# Patient Record
Sex: Female | Born: 1965 | Race: Black or African American | Hispanic: Yes | Marital: Married | Smoking: Never smoker
Health system: Southern US, Community
[De-identification: ages and names within clinical notes are randomized; demographics above are authoritative.]

## PROBLEM LIST (undated history)

## (undated) DIAGNOSIS — R102 Pelvic and perineal pain: Secondary | ICD-10-CM

## (undated) DIAGNOSIS — I10 Essential (primary) hypertension: Secondary | ICD-10-CM

## (undated) DIAGNOSIS — K5900 Constipation, unspecified: Secondary | ICD-10-CM

## (undated) DIAGNOSIS — G4730 Sleep apnea, unspecified: Secondary | ICD-10-CM

## (undated) DIAGNOSIS — N950 Postmenopausal bleeding: Secondary | ICD-10-CM

## (undated) DIAGNOSIS — Z8041 Family history of malignant neoplasm of ovary: Secondary | ICD-10-CM

## (undated) HISTORY — PX: TUBAL LIGATION: SHX77

## (undated) HISTORY — DX: Pelvic and perineal pain: R10.2

## (undated) HISTORY — DX: Constipation, unspecified: K59.00

## (undated) HISTORY — DX: Sleep apnea, unspecified: G47.30

## (undated) HISTORY — DX: Essential (primary) hypertension: I10

## (undated) HISTORY — DX: Family history of malignant neoplasm of ovary: Z80.41

## (undated) HISTORY — DX: Postmenopausal bleeding: N95.0

---

## 2009-01-01 ENCOUNTER — Emergency Department: Payer: Self-pay | Admitting: Emergency Medicine

## 2009-04-02 ENCOUNTER — Ambulatory Visit: Payer: Self-pay | Admitting: Internal Medicine

## 2010-04-22 ENCOUNTER — Ambulatory Visit: Payer: Self-pay

## 2012-01-19 ENCOUNTER — Ambulatory Visit: Payer: Self-pay

## 2013-03-15 ENCOUNTER — Ambulatory Visit: Payer: Self-pay

## 2013-08-31 ENCOUNTER — Ambulatory Visit: Payer: Self-pay | Admitting: Unknown Physician Specialty

## 2015-01-24 ENCOUNTER — Encounter: Payer: Self-pay | Admitting: Internal Medicine

## 2015-02-11 ENCOUNTER — Ambulatory Visit (INDEPENDENT_AMBULATORY_CARE_PROVIDER_SITE_OTHER): Payer: BLUE CROSS/BLUE SHIELD | Admitting: Obstetrics and Gynecology

## 2015-02-11 ENCOUNTER — Encounter: Payer: Self-pay | Admitting: Obstetrics and Gynecology

## 2015-02-11 VITALS — BP 137/84 | HR 93 | Ht 59.0 in | Wt 140.2 lb

## 2015-02-11 DIAGNOSIS — Z803 Family history of malignant neoplasm of breast: Secondary | ICD-10-CM | POA: Diagnosis not present

## 2015-02-11 DIAGNOSIS — Z8041 Family history of malignant neoplasm of ovary: Secondary | ICD-10-CM | POA: Diagnosis not present

## 2015-02-11 DIAGNOSIS — Z800 Family history of malignant neoplasm of digestive organs: Secondary | ICD-10-CM | POA: Diagnosis not present

## 2015-02-11 DIAGNOSIS — N950 Postmenopausal bleeding: Secondary | ICD-10-CM

## 2015-02-11 HISTORY — DX: Postmenopausal bleeding: N95.0

## 2015-02-11 NOTE — Patient Instructions (Signed)
1.  Endometrial biopsy is done today because of postmenopausal bleeding. 2.  There is no evidence of uterine fibroids on ultrasound or clinical exam. 3.  There is a family history of cancer risk in sister (ovarian), brother (colon),Maternal aunt ( breast cancer) 4.  Recommend genetic testing through the my risk profile.  Literature is given. 5.  Return in 2 weeks

## 2015-02-11 NOTE — Progress Notes (Signed)
GYN ENCOUNTER NOTE  Subjective:       Barbara Cantrell is a 50 y.o. [id number] female is here for gynecologic evaluation of the following issues:  1. Postmenopausal bleeding.     Gynecologic History Patient's last menstrual period was 01/09/2015 (approximate). Contraception: Menopause  Obstetric History OB History  Gravida Para Term Preterm AB SAB TAB Ectopic Multiple Living  3 3 2 1      2     # Outcome Date GA Lbr Len/2nd Weight Sex Delivery Anes PTL Lv  3 Term 1997   7 lb 2.4 oz (3.243 kg) F Vag-Spont   Y  2 Term 1995   7 lb 2.2 oz (3.239 kg) F Vag-Spont   Y  1 Preterm 1994     Vag-Spont   FD      Past Medical History  Diagnosis Date  . Hypertension   . Pelvic pain in female   . Constipation   . PMB (postmenopausal bleeding)     Past Surgical History  Procedure Laterality Date  . Tubal ligation      No current outpatient prescriptions on file prior to visit.   No current facility-administered medications on file prior to visit.    No Known Allergies  Social History   Social History  . Marital Status: Married    Spouse Name: N/A  . Number of Children: N/A  . Years of Education: N/A   Occupational History  . Not on file.   Social History Main Topics  . Smoking status: Never Smoker   . Smokeless tobacco: Not on file  . Alcohol Use: Yes     Comment: occas  . Drug Use: No  . Sexual Activity: Yes    Birth Control/ Protection: Surgical   Other Topics Concern  . Not on file   Social History Narrative  . No narrative on file    Family History  Problem Relation Age of Onset  . Ovarian cancer Sister   . Colon cancer Brother   . Asthma Maternal Aunt   . Diabetes Maternal Aunt   . Heart disease Neg Hx     The following portions of the patient's history were reviewed and updated as appropriate: allergies, current medications, past family history, past medical history, past social history, past surgical history and problem list.  Review of  Systems Review of Systems - General ROS: negative for - chills, fatigue, fever, hot flashes, malaise or night sweats Hematological and Lymphatic ROS: negative for - bleeding problems or swollen lymph nodes Gastrointestinal ROS: negative for - abdominal pain, blood in stools, change in bowel habits and nausea/vomiting Musculoskeletal ROS: negative for - joint pain, muscle pain or muscular weakness Genito-Urinary ROS: negative for - change in menstrual cycle, dysmenorrhea, dyspareunia, dysuria, genital discharge, genital ulcers, hematuria, incontinence, irregular/heavy menses, nocturia or pelvic pain  Objective:   BP 137/84 mmHg  Pulse 93  Ht 4\' 11"  (1.499 m)  Wt 140 lb 3.4 oz (63.599 kg)  BMI 28.30 kg/m2  LMP 01/09/2015 (Approximate) CONSTITUTIONAL: Well-developed, well-nourished female in no acute distress.  HENT:  Normocephalic, atraumatic.  NECK: Not examined SKIN: Skin is warm and dry. No rash noted. Not diaphoretic. No erythema. No pallor. NEUROLGIC: Alert and oriented to person, place, and time. PSYCHIATRIC: Normal mood and affect. Normal behavior. Normal judgment and thought content. CARDIOVASCULAR:Not Examined RESPIRATORY: Not Examined BREASTS: Not Examined ABDOMEN: Soft, non distended; Non tender.  No Organomegaly. PELVIC:  External Genitalia: Normal  BUS: Normal  Vagina: Normal  Cervix:  Normal  Uterus: Normal size, shape,consistency, mobile; Anteverted; nontender  Adnexa: Normal  RV: Normal External exam  Bladder: Nontender MUSCULOSKELETAL: Normal range of motion. No tenderness.  No cyanosis, clubbing, or edema.  Outside ultrasound is reviewed with no evidence of gynecologic pathology.  The endometrial stripe was not measured.  PROCEDURE: Endometrial biopsy  Endometrial Biopsy Procedure Note  Pre-operative Diagnosis: Postmenopausal bleeding  Post-operative Diagnosis: Postmenopausal bleeding  Procedure Details   Urine pregnancy test was not done.  The risks  (including infection, bleeding, pain, and uterine perforation) and benefits of the procedure were explained to the patient and Verbal informed consent was obtained.  Antibiotic prophylaxis against endocarditis was not indicated.   The patient was placed in the dorsal lithotomy position.  Bimanual exam showed the uterus to be in the anteroflexed position.  A Graves' speculum inserted in the vagina, and the cervix prepped with povidone iodine.  Endocervical curettage with a Kevorkian curette was not performed.   A sharp tenaculum was Not applied to the anterior lip of the cervix for stabilization.  A sterile uterine sound was used to sound the uterus to a depth of 7.5cm.  A Mylex 3mm curette was used to sample the endometrium.  Sample was sent for pathologic examination.  Condition: Stable  Complications: None  Plan:  The patient was advised to call for any fever or for prolonged or severe pain or bleeding. She was advised to use OTC acetaminophen and OTC ibuprofen as needed for mild to moderate pain. She was advised to avoid vaginal intercourse for 48 hours or until the bleeding has completely stopped.  Attending Physician Documentation: Herold Harms, MD     Assessment:   1. PMB (postmenopausal bleeding) - Pathology  2. Family history of breast cancer  3. Family history of colon cancer  4. Family history of ovarian cancer     Plan:   1.  Endometrial biopsy. 2.  Discussion regarding My Risk Assessment genetic testing was completed.  Literature is given. 3.  Return in 2 weeks for follow-up on endometrial biopsy results and further management planning; possible consideration of genetic testing.  Herold Harms, MD  Note: This dictation was prepared with Dragon dictation along with smaller phrase technology. Any transcriptional errors that result from this process are unintentional.

## 2015-02-13 LAB — PATHOLOGY

## 2015-02-25 ENCOUNTER — Encounter: Payer: Self-pay | Admitting: Obstetrics and Gynecology

## 2015-02-25 ENCOUNTER — Ambulatory Visit (INDEPENDENT_AMBULATORY_CARE_PROVIDER_SITE_OTHER): Payer: BLUE CROSS/BLUE SHIELD | Admitting: Obstetrics and Gynecology

## 2015-02-25 VITALS — BP 121/74 | HR 79 | Ht 59.0 in | Wt 141.0 lb

## 2015-02-25 DIAGNOSIS — Z803 Family history of malignant neoplasm of breast: Secondary | ICD-10-CM

## 2015-02-25 DIAGNOSIS — Z800 Family history of malignant neoplasm of digestive organs: Secondary | ICD-10-CM | POA: Diagnosis not present

## 2015-02-25 DIAGNOSIS — Z8041 Family history of malignant neoplasm of ovary: Secondary | ICD-10-CM | POA: Diagnosis not present

## 2015-02-25 DIAGNOSIS — N950 Postmenopausal bleeding: Secondary | ICD-10-CM | POA: Diagnosis not present

## 2015-02-25 NOTE — Progress Notes (Signed)
Chief complaint: 1.  Postmenopausal bleeding. 2.  Family history of breast cancer, colon cancer and ovarian cancer  Patient presents for conference to follow-up on endometrial biopsy.  4 done for postmenopausal bleeding.  Pathology demonstrated no hyperplasia or carcinoma; atrophic appearing endometrium was present.  Patient had been given My Risk Assessment.  Information regarding genetic testing because of family history of colon cancer, ovarian cancer and breast cancer.  She will contact the company and check on cost and will likely get the blood work done.  When results are available, she will return for follow-up conference.  ASSESSMENT: 1.  Postmenopausal bleeding with benign endometrial biopsy. 2.  Family history of breast, colon, and ovarian cancers.  PLAN: 1.  Monitor for any further abnormal uterine bleeding.  Reassessment will be needed if bleeding occurs after 08/09/2015. 2.  Patient is to contact insurance company to see what cost.  There is too genetic testing.  Once testing is completed and results are obtained, we will contact patient review of findings.  A total of 15 minutes were spent face-to-face with the patient during this encounter and over half of that time dealt with counseling and coordination of care.  Herold Harms, MD  Note: This dictation was prepared with Dragon dictation along with smaller phrase technology. Any transcriptional errors that result from this process are unintentional.

## 2015-02-25 NOTE — Patient Instructions (Signed)
1.  Patient is to have my risk genetic testing done. 2.  We will contact patient with results come in, and then call her in for conference. 3.  Endometrial biopsy was benign.  No further workup is needed of this condition.  Unless abnormal bleeding develops after August 09, 2015.  If that happens, reassessment will be needed. 4.  Continue follow-up with Dr. Tora Kindred, [facility] NP

## 2015-02-28 ENCOUNTER — Other Ambulatory Visit
Admission: RE | Admit: 2015-02-28 | Discharge: 2015-02-28 | Disposition: A | Discharge status: Home / Self Care | Payer: BLUE CROSS/BLUE SHIELD | Source: Ambulatory Visit | Attending: Internal Medicine | Admitting: Internal Medicine

## 2015-02-28 DIAGNOSIS — N83201 Unspecified ovarian cyst, right side: Secondary | ICD-10-CM | POA: Diagnosis not present

## 2015-02-28 DIAGNOSIS — N83202 Unspecified ovarian cyst, left side: Secondary | ICD-10-CM | POA: Insufficient documentation

## 2015-02-28 DIAGNOSIS — N950 Postmenopausal bleeding: Secondary | ICD-10-CM | POA: Insufficient documentation

## 2015-02-28 LAB — TSH: TSH: 0.777 u[IU]/mL (ref 0.350–4.500)

## 2015-02-28 LAB — T4, FREE: FREE T4: 0.71 ng/dL (ref 0.61–1.12)

## 2015-03-01 LAB — FSH/LH
FSH: 70.6 m[IU]/mL
LH: 32 m[IU]/mL

## 2015-03-01 LAB — T3: T3 TOTAL: 149 ng/dL (ref 71–180)

## 2015-03-01 LAB — ESTRADIOL: Estradiol: 6.9 pg/mL

## 2015-09-02 DIAGNOSIS — M545 Low back pain: Secondary | ICD-10-CM | POA: Diagnosis not present

## 2015-09-02 DIAGNOSIS — I10 Essential (primary) hypertension: Secondary | ICD-10-CM | POA: Diagnosis not present

## 2015-09-02 DIAGNOSIS — M542 Cervicalgia: Secondary | ICD-10-CM | POA: Diagnosis not present

## 2015-09-02 DIAGNOSIS — M791 Myalgia: Secondary | ICD-10-CM | POA: Diagnosis not present

## 2015-09-04 ENCOUNTER — Ambulatory Visit
Admission: RE | Admit: 2015-09-04 | Discharge: 2015-09-04 | Disposition: A | Discharge status: Home / Self Care | Payer: BLUE CROSS/BLUE SHIELD | Source: Ambulatory Visit | Attending: Nurse Practitioner | Admitting: Nurse Practitioner

## 2015-09-04 ENCOUNTER — Other Ambulatory Visit: Payer: Self-pay | Admitting: Nurse Practitioner

## 2015-09-04 DIAGNOSIS — R52 Pain, unspecified: Secondary | ICD-10-CM

## 2015-09-04 DIAGNOSIS — M545 Low back pain: Secondary | ICD-10-CM | POA: Insufficient documentation

## 2015-09-04 DIAGNOSIS — M542 Cervicalgia: Secondary | ICD-10-CM | POA: Insufficient documentation

## 2015-09-04 DIAGNOSIS — S199XXA Unspecified injury of neck, initial encounter: Secondary | ICD-10-CM | POA: Diagnosis not present

## 2016-03-23 DIAGNOSIS — I10 Essential (primary) hypertension: Secondary | ICD-10-CM | POA: Diagnosis not present

## 2016-03-23 DIAGNOSIS — J309 Allergic rhinitis, unspecified: Secondary | ICD-10-CM | POA: Diagnosis not present

## 2016-03-23 DIAGNOSIS — M542 Cervicalgia: Secondary | ICD-10-CM | POA: Diagnosis not present

## 2016-03-23 DIAGNOSIS — Z0001 Encounter for general adult medical examination with abnormal findings: Secondary | ICD-10-CM | POA: Diagnosis not present

## 2016-04-13 DIAGNOSIS — Z1231 Encounter for screening mammogram for malignant neoplasm of breast: Secondary | ICD-10-CM | POA: Diagnosis not present

## 2016-11-08 DIAGNOSIS — D23121 Other benign neoplasm of skin of left upper eyelid, including canthus: Secondary | ICD-10-CM | POA: Diagnosis not present

## 2016-11-08 DIAGNOSIS — L739 Follicular disorder, unspecified: Secondary | ICD-10-CM | POA: Diagnosis not present

## 2016-11-08 DIAGNOSIS — L918 Other hypertrophic disorders of the skin: Secondary | ICD-10-CM | POA: Diagnosis not present

## 2016-11-08 DIAGNOSIS — D485 Neoplasm of uncertain behavior of skin: Secondary | ICD-10-CM | POA: Diagnosis not present

## 2016-12-06 DIAGNOSIS — D23111 Other benign neoplasm of skin of right upper eyelid, including canthus: Secondary | ICD-10-CM | POA: Diagnosis not present

## 2016-12-06 DIAGNOSIS — D23121 Other benign neoplasm of skin of left upper eyelid, including canthus: Secondary | ICD-10-CM | POA: Diagnosis not present

## 2016-12-06 DIAGNOSIS — D485 Neoplasm of uncertain behavior of skin: Secondary | ICD-10-CM | POA: Diagnosis not present

## 2017-03-28 ENCOUNTER — Encounter: Payer: Self-pay | Admitting: Nurse Practitioner

## 2017-03-28 ENCOUNTER — Ambulatory Visit: Payer: BLUE CROSS/BLUE SHIELD | Admitting: Nurse Practitioner

## 2017-03-28 VITALS — BP 110/70 | HR 79 | Resp 16 | Ht 59.0 in | Wt 122.0 lb

## 2017-03-28 DIAGNOSIS — Z1239 Encounter for other screening for malignant neoplasm of breast: Secondary | ICD-10-CM

## 2017-03-28 DIAGNOSIS — R300 Dysuria: Secondary | ICD-10-CM | POA: Diagnosis not present

## 2017-03-28 DIAGNOSIS — Z1231 Encounter for screening mammogram for malignant neoplasm of breast: Secondary | ICD-10-CM

## 2017-03-28 DIAGNOSIS — E782 Mixed hyperlipidemia: Secondary | ICD-10-CM

## 2017-03-28 DIAGNOSIS — Z0001 Encounter for general adult medical examination with abnormal findings: Secondary | ICD-10-CM | POA: Diagnosis not present

## 2017-03-28 DIAGNOSIS — I10 Essential (primary) hypertension: Secondary | ICD-10-CM | POA: Insufficient documentation

## 2017-03-28 DIAGNOSIS — Z124 Encounter for screening for malignant neoplasm of cervix: Secondary | ICD-10-CM | POA: Diagnosis not present

## 2017-03-28 DIAGNOSIS — E559 Vitamin D deficiency, unspecified: Secondary | ICD-10-CM | POA: Diagnosis not present

## 2017-03-28 NOTE — Progress Notes (Signed)
[facility] [address] [place], [place] [id number]  Internal MEDICINE  Office Visit Note  Patient Name: Barbara Cantrell  [id number]  [id number]  Date of Service: 03/28/2017  Chief Complaint  Patient presents with  . Hypertension     Hypertension  This is a chronic problem. The current episode started more than 1 year ago. The problem has been gradually improving since onset. The problem is controlled. Associated symptoms include malaise/fatigue. Pertinent negatives include no chest pain, headaches, neck pain, palpitations or shortness of breath. Risk factors for coronary artery disease include post-menopausal state. Past treatments include calcium channel blockers. The current treatment provides moderate improvement. There are no compliance problems.    Pt is here for routine health maintenance examination  Current Medication: Outpatient Encounter Medications as of 03/28/2017  Medication Sig Note  . amLODipine (NORVASC) 5 MG tablet Take by mouth. 02/11/2015: [facility]  . fluticasone (FLONASE) 50 MCG/ACT nasal spray Place 1 spray into both nostrils.   Marland Kitchen. ibuprofen (ADVIL,MOTRIN) 600 MG tablet TAKE 1 TABLET(S) BY MOUTH THREE TIMES A DAY AS NEEDED FOR PAIN 02/11/2015: Received from: External Pharmacy  . montelukast (SINGULAIR) 10 MG tablet TAKE 1 TABLET BY MOUTH EVERY DAY FOR SINUSES    No facility-administered encounter medications on file as of 03/28/2017.     Surgical History: Past Surgical History:  Procedure Laterality Date  . TUBAL LIGATION      Medical History: Past Medical History:  Diagnosis Date  . Constipation   . Hypertension   . Pelvic pain in female   . PMB (postmenopausal bleeding)   . Postmenopausal bleeding 02/11/2015    Family History: Family History  Problem Relation Age of Onset  . Ovarian cancer Sister   . Colon cancer Brother   . Asthma Maternal Aunt   . Diabetes Maternal Aunt   . Kidney disease Maternal  Aunt   . Heart disease Neg Hx       Review of Systems  Constitutional: Positive for malaise/fatigue. Negative for activity change, chills, fatigue and unexpected weight change.  HENT: Negative for congestion, postnasal drip, rhinorrhea, sneezing and sore throat.   Eyes: Negative.  Negative for redness.  Respiratory: Negative for cough, chest tightness and shortness of breath.   Cardiovascular: Negative for chest pain and palpitations.  Gastrointestinal: Negative for abdominal pain, constipation, diarrhea, nausea and vomiting.  Genitourinary: Negative.  Negative for dysuria and frequency.  Musculoskeletal: Positive for arthralgias. Negative for back pain, joint swelling and neck pain.       Bilateral wrist and hand pin, generally during the night.   Skin: Negative for rash.  Neurological: Negative for tremors, weakness, numbness and headaches.  Hematological: Negative for adenopathy. Does not bruise/bleed easily.  Psychiatric/Behavioral: Negative for behavioral problems (Depression), sleep disturbance and suicidal ideas. The patient is not nervous/anxious.     Today's Vitals   03/28/17 0955  BP: 110/70  Pulse: 79  Resp: 16  SpO2: 99%  Weight: 122 lb (55.3 kg)  Height: 4\' 11"  (1.499 m)    Physical Exam  Constitutional: She is oriented to person, place, and time. She appears well-developed and well-nourished. No distress.  HENT:  Head: Normocephalic and atraumatic.  Mouth/Throat: Oropharynx is clear and moist. No oropharyngeal exudate.  Eyes: EOM are normal. Pupils are equal, round, and reactive to light.  Neck: Normal range of motion. Neck supple. No JVD present. No tracheal deviation present. No thyromegaly present.  Cardiovascular: Normal rate, regular rhythm and normal  heart sounds. Exam reveals no gallop and no friction rub.  No murmur heard. Pulmonary/Chest: Effort normal. No respiratory distress. She has no wheezes. She has no rales. She exhibits no tenderness. Right  breast exhibits no inverted nipple, no mass, no nipple discharge, no skin change and no tenderness. Left breast exhibits no inverted nipple, no mass, no nipple discharge, no skin change and no tenderness. Breasts are symmetrical.  Abdominal: Soft. Bowel sounds are normal.  Genitourinary: Rectum normal. No breast swelling, tenderness, discharge or bleeding. Pelvic exam was performed with patient supine. There is no rash, tenderness, lesion or injury on the right labia. There is no rash, tenderness, lesion or injury on the left labia. Uterus is not deviated, not enlarged, not fixed and not tender. Cervix exhibits no motion tenderness, no discharge and no friability. Right adnexum displays no mass, no tenderness and no fullness. Left adnexum displays no mass and no fullness. No erythema, tenderness or bleeding in the vagina. No foreign body in the vagina. No signs of injury around the vagina. No vaginal discharge found.  Genitourinary Comments: There is no tenderness, mass, or organomegaly present during bimanual exam.   Musculoskeletal: Normal range of motion.  Lymphadenopathy:    She has no cervical adenopathy.  Neurological: She is alert and oriented to person, place, and time. No cranial nerve deficit.  Skin: Skin is warm and dry. She is not diaphoretic.  Psychiatric: She has a normal mood and affect. Her behavior is normal. Judgment and thought content normal.  Nursing note and vitals reviewed.  Assessment/Plan: 1. Encounter for general adult medical examination with abnormal findings Annual wellness visit today with pap smear.  - CBC with Differential/Platelet - Comprehensive metabolic panel - T4, free - TSH  2. Essential hypertension Dong very well. Continue bp medication as prescribed  - Comprehensive metabolic panel - T4, free - TSH - Lipid panel  3. Mixed hyperlipidemia - Lipid panel  4. Vitamin D deficiency - Vitamin D 1,25 dihydroxy  5. Dysuria - Urinalysis, Routine w  reflex microscopic  6. Screening for breast cancer - MM DIGITAL SCREENING BILATERAL; Future  7. Routine cervical smear - Pap IG and HPV (high risk) DNA detection  General Counseling: Harriett Sine verbalizes understanding of the findings of todays visit and agrees with plan of treatment. I have discussed any further diagnostic evaluation that may be needed or ordered today. We also reviewed her medications today. she has been encouraged to call the office with any questions or concerns that should arise related to todays visit.   This patient was seen by Vincent Gros, FNP- C in Collaboration with Dr Lyndon Code as a part of collaborative care agreement    Orders Placed This Encounter  Procedures  . MM DIGITAL SCREENING BILATERAL  . Urinalysis, Routine w reflex microscopic  . CBC with Differential/Platelet  . Comprehensive metabolic panel  . T4, free  . TSH  . Lipid panel  . Vitamin D 1,25 dihydroxy      Time spent: 31 Minutes      Lyndon Code, MD  Internal Medicine

## 2017-03-29 LAB — URINALYSIS, ROUTINE W REFLEX MICROSCOPIC
Bilirubin, UA: NEGATIVE
Glucose, UA: NEGATIVE
KETONES UA: NEGATIVE
Leukocytes, UA: NEGATIVE
NITRITE UA: NEGATIVE
Protein, UA: NEGATIVE
RBC, UA: NEGATIVE
SPEC GRAV UA: 1.017 (ref 1.005–1.030)
Urobilinogen, Ur: 0.2 mg/dL (ref 0.2–1.0)
pH, UA: 7 (ref 5.0–7.5)

## 2017-03-31 LAB — PAP IG AND HPV HIGH-RISK
HPV, HIGH-RISK: NEGATIVE
PAP SMEAR COMMENT: 0

## 2017-04-06 NOTE — Progress Notes (Signed)
Let pt know about results.

## 2017-04-15 DIAGNOSIS — Z1231 Encounter for screening mammogram for malignant neoplasm of breast: Secondary | ICD-10-CM | POA: Diagnosis not present

## 2017-04-22 ENCOUNTER — Other Ambulatory Visit: Payer: Self-pay | Admitting: Internal Medicine

## 2017-09-27 ENCOUNTER — Ambulatory Visit: Payer: Self-pay | Admitting: Nurse Practitioner

## 2017-09-28 ENCOUNTER — Ambulatory Visit: Payer: BLUE CROSS/BLUE SHIELD | Admitting: Nurse Practitioner

## 2017-09-28 ENCOUNTER — Encounter: Payer: Self-pay | Admitting: Nurse Practitioner

## 2017-09-28 VITALS — BP 128/83 | HR 85 | Resp 16 | Ht 59.0 in | Wt 128.6 lb

## 2017-09-28 DIAGNOSIS — E782 Mixed hyperlipidemia: Secondary | ICD-10-CM

## 2017-09-28 DIAGNOSIS — I10 Essential (primary) hypertension: Secondary | ICD-10-CM

## 2017-09-28 NOTE — Progress Notes (Signed)
[facility] [address] [place], [place] [address]  Internal MEDICINE  Office Visit Note  Patient Name: Barbara Cantrell  [id number]  [id number]  Date of Service: 09/28/2017  Chief Complaint  Patient presents with  . Hypertension    6 month follow up    Hypertension  This is a chronic problem. The current episode started in the past 7 days. The problem has been gradually improving since onset. The problem is controlled. Pertinent negatives include no chest pain, headaches, neck pain, palpitations or shortness of breath. There are no associated agents to hypertension. Risk factors for coronary artery disease include post-menopausal state. Past treatments include calcium channel blockers. The current treatment provides moderate improvement. There are no compliance problems.        Current Medication: Outpatient Encounter Medications as of 09/28/2017  Medication Sig Note  . amLODipine (NORVASC) 5 MG tablet TAKE 1 AND 1/2 TABLET BY MOUTH EVERY DAY FOR BLOOD PRESSURE   . fluticasone (FLONASE) 50 MCG/ACT nasal spray Place 1 spray into both nostrils.   Marland Kitchen ibuprofen (ADVIL,MOTRIN) 600 MG tablet TAKE 1 TABLET(S) BY MOUTH THREE TIMES A DAY AS NEEDED FOR PAIN 02/11/2015: Received from: External Pharmacy  . montelukast (SINGULAIR) 10 MG tablet TAKE 1 TABLET BY MOUTH EVERY DAY FOR SINUSES    No facility-administered encounter medications on file as of 09/28/2017.     Surgical History: Past Surgical History:  Procedure Laterality Date  . TUBAL LIGATION      Medical History: Past Medical History:  Diagnosis Date  . Constipation   . Hypertension   . Pelvic pain in female   . PMB (postmenopausal bleeding)   . Postmenopausal bleeding 02/11/2015    Family History: Family History  Problem Relation Age of Onset  . Ovarian cancer Sister   . Colon cancer Brother   . Asthma Maternal Aunt   . Diabetes Maternal Aunt   . Kidney disease Maternal Aunt   . Heart disease Neg Hx      Social History   Socioeconomic History  . Marital status: Married    Spouse name: Not on file  . Number of children: Not on file  . Years of education: Not on file  . Highest education level: Not on file  Occupational History  . Not on file  Social Needs  . Financial resource strain: Not on file  . Food insecurity:    Worry: Not on file    Inability: Not on file  . Transportation needs:    Medical: Not on file    Non-medical: Not on file  Tobacco Use  . Smoking status: Never Smoker  . Smokeless tobacco: Never Used  Substance and Sexual Activity  . Alcohol use: Yes    Comment: occas  . Drug use: No  . Sexual activity: Yes    Birth control/protection: Surgical  Lifestyle  . Physical activity:    Days per week: Not on file    Minutes per session: Not on file  . Stress: Not on file  Relationships  . Social connections:    Talks on phone: Not on file    Gets together: Not on file    Attends religious service: Not on file    Active member of club or organization: Not on file    Attends meetings of clubs or organizations: Not on file    Relationship status: Not on file  . Intimate partner violence:    Fear of current or ex partner: Not on file  Emotionally abused: Not on file    Physically abused: Not on file    Forced sexual activity: Not on file  Other Topics Concern  . Not on file  Social History Narrative  . Not on file      Review of Systems  Constitutional: Negative for activity change, chills, fatigue and unexpected weight change.  HENT: Negative for congestion, postnasal drip, rhinorrhea, sneezing and sore throat.   Eyes: Negative.  Negative for redness.  Respiratory: Negative for cough, chest tightness and shortness of breath.   Cardiovascular: Negative for chest pain and palpitations.  Gastrointestinal: Negative for abdominal pain, constipation, diarrhea, nausea and vomiting.  Endocrine: Negative for cold intolerance, heat intolerance,  polydipsia, polyphagia and polyuria.  Genitourinary: Negative.  Negative for dysuria and frequency.  Musculoskeletal: Negative for arthralgias, back pain, joint swelling and neck pain.       .   Skin: Negative for rash.  Allergic/Immunologic: Negative for environmental allergies.  Neurological: Negative for tremors, weakness, numbness and headaches.  Hematological: Negative for adenopathy. Does not bruise/bleed easily.  Psychiatric/Behavioral: Negative for behavioral problems (Depression), sleep disturbance and suicidal ideas. The patient is not nervous/anxious.     Today's Vitals   09/28/17 0907  BP: 128/83  Pulse: 85  Resp: 16  SpO2: 98%  Weight: 128 lb 9.6 oz (58.3 kg)  Height: 4\' 11"  (1.499 m)    Physical Exam  Constitutional: She is oriented to person, place, and time. She appears well-developed and well-nourished. No distress.  HENT:  Head: Normocephalic and atraumatic.  Nose: Nose normal.  Mouth/Throat: Oropharynx is clear and moist. No oropharyngeal exudate.  Eyes: Pupils are equal, round, and reactive to light. EOM are normal.  Neck: Normal range of motion. Neck supple. No JVD present. Carotid bruit is not present. No tracheal deviation present. No thyromegaly present.  Cardiovascular: Normal rate, regular rhythm and normal heart sounds. Exam reveals no gallop and no friction rub.  No murmur heard. Pulmonary/Chest: Effort normal and breath sounds normal. No respiratory distress. She has no wheezes. She has no rales. She exhibits no tenderness.  Abdominal: Soft. Bowel sounds are normal. There is no tenderness.  Musculoskeletal: Normal range of motion.  Lymphadenopathy:    She has no cervical adenopathy.  Neurological: She is alert and oriented to person, place, and time. No cranial nerve deficit.  Skin: Skin is warm and dry. Capillary refill takes less than 2 seconds. She is not diaphoretic.  Psychiatric: She has a normal mood and affect. Her behavior is normal. Judgment  and thought content normal.  Nursing note and vitals reviewed.  Assessment/Plan:  1. Essential hypertension Well controlled. Continue bp medication as prescribed   2. Mixed hyperlipidemia Reordered fasting lipid panel today. Will treat as indicated.   General Counseling: Lily PeerNancy verbalizes understanding of the findings of todays visit and agrees with plan of treatment. I have discussed any further diagnostic evaluation that may be needed or ordered today. We also reviewed her medications today. she has been encouraged to call the office with any questions or concerns that should arise related to todays visit.   Hypertension Counseling:   The following hypertensive lifestyle modification were recommended and discussed:  1. Limiting alcohol intake to less than 1 oz/day of ethanol:(24 oz of beer or 8 oz of wine or 2 oz of 100-proof whiskey). 2. Take baby ASA 81 mg daily. 3. Importance of regular aerobic exercise and losing weight. 4. Reduce dietary saturated fat and cholesterol intake for overall cardiovascular health. 5.  Maintaining adequate dietary potassium, calcium, and magnesium intake. 6. Regular monitoring of the blood pressure. 7. Reduce sodium intake to less than 100 mmol/day (less than 2.3 gm of sodium or less than 6 gm of sodium choride)   This patient was seen by Vincent GrosHeather Genova Kiner FNP Collaboration with Dr Lyndon CodeFozia M Khan as a part of collaborative care agreement    Time spent: 15 Minutes    Dr Lyndon CodeFozia M Khan Internal medicine

## 2017-12-26 ENCOUNTER — Other Ambulatory Visit: Payer: Self-pay | Admitting: Nurse Practitioner

## 2017-12-26 DIAGNOSIS — E782 Mixed hyperlipidemia: Secondary | ICD-10-CM | POA: Diagnosis not present

## 2017-12-26 DIAGNOSIS — Z0001 Encounter for general adult medical examination with abnormal findings: Secondary | ICD-10-CM | POA: Diagnosis not present

## 2017-12-26 DIAGNOSIS — I10 Essential (primary) hypertension: Secondary | ICD-10-CM | POA: Diagnosis not present

## 2017-12-26 DIAGNOSIS — E559 Vitamin D deficiency, unspecified: Secondary | ICD-10-CM | POA: Diagnosis not present

## 2017-12-27 LAB — CBC
HEMOGLOBIN: 13.3 g/dL (ref 11.1–15.9)
Hematocrit: 39.7 % (ref 34.0–46.6)
MCH: 27.9 pg (ref 26.6–33.0)
MCHC: 33.5 g/dL (ref 31.5–35.7)
MCV: 83 fL (ref 79–97)
Platelets: 407 10*3/uL (ref 150–450)
RBC: 4.77 x10E6/uL (ref 3.77–5.28)
RDW: 13.1 % (ref 12.3–15.4)
WBC: 7.4 10*3/uL (ref 3.4–10.8)

## 2017-12-27 LAB — COMPREHENSIVE METABOLIC PANEL
ALT: 15 IU/L (ref 0–32)
AST: 12 IU/L (ref 0–40)
Albumin/Globulin Ratio: 1.7 (ref 1.2–2.2)
Albumin: 4.8 g/dL (ref 3.5–5.5)
Alkaline Phosphatase: 65 IU/L (ref 39–117)
BUN/Creatinine Ratio: 17 (ref 9–23)
BUN: 12 mg/dL (ref 6–24)
Bilirubin Total: 0.3 mg/dL (ref 0.0–1.2)
CO2: 21 mmol/L (ref 20–29)
CREATININE: 0.7 mg/dL (ref 0.57–1.00)
Calcium: 9.7 mg/dL (ref 8.7–10.2)
Chloride: 102 mmol/L (ref 96–106)
GFR calc Af Amer: 114 mL/min/{1.73_m2} (ref >59)
GFR, EST NON AFRICAN AMERICAN: 99 mL/min/{1.73_m2} (ref >59)
GLUCOSE: 97 mg/dL (ref 65–99)
Globulin, Total: 2.8 g/dL (ref 1.5–4.5)
Potassium: 4.1 mmol/L (ref 3.5–5.2)
Sodium: 139 mmol/L (ref 134–144)
Total Protein: 7.6 g/dL (ref 6.0–8.5)

## 2017-12-27 LAB — TSH: TSH: 1.07 u[IU]/mL (ref 0.450–4.500)

## 2017-12-27 LAB — LIPID PANEL W/O CHOL/HDL RATIO
Cholesterol, Total: 272 mg/dL — ABNORMAL HIGH (ref 100–199)
HDL: 50 mg/dL (ref >39)
LDL Calculated: 178 mg/dL — ABNORMAL HIGH (ref 0–99)
Triglycerides: 220 mg/dL — ABNORMAL HIGH (ref 0–149)
VLDL CHOLESTEROL CAL: 44 mg/dL — AB (ref 5–40)

## 2017-12-27 LAB — T4, FREE: FREE T4: 0.95 ng/dL (ref 0.82–1.77)

## 2017-12-27 LAB — VITAMIN D 25 HYDROXY (VIT D DEFICIENCY, FRACTURES): Vit D, 25-Hydroxy: 18.4 ng/mL — ABNORMAL LOW (ref 30.0–100.0)

## 2018-01-12 ENCOUNTER — Other Ambulatory Visit: Payer: Self-pay | Admitting: Nurse Practitioner

## 2018-01-12 ENCOUNTER — Telehealth: Payer: Self-pay

## 2018-01-12 DIAGNOSIS — E782 Mixed hyperlipidemia: Secondary | ICD-10-CM

## 2018-01-12 DIAGNOSIS — E559 Vitamin D deficiency, unspecified: Secondary | ICD-10-CM

## 2018-01-12 MED ORDER — ERGOCALCIFEROL 1.25 MG (50000 UT) PO CAPS: 50000.0000 [IU] | ORAL_CAPSULE | ORAL | 5 refills | Status: DC

## 2018-01-12 MED ORDER — ROSUVASTATIN CALCIUM 5 MG PO TABS: 5.0000 mg | ORAL_TABLET | Freq: Every day | ORAL | 3 refills | Status: DC

## 2018-01-12 NOTE — Progress Notes (Signed)
Add drisdol 50000iu weekly for vitamin d deficiency.  

## 2018-01-12 NOTE — Progress Notes (Signed)
Moderate elevation of cholesterol panel. Added generic crestor at 5mg  which she should take every evening. Her other blood work results were good.

## 2018-01-12 NOTE — Telephone Encounter (Signed)
Informed pt of results and that her new [facility]

## 2018-01-12 NOTE — Telephone Encounter (Signed)
-----   Message from Carlean JewsHeather E Boscia, NP sent at 01/12/2018  8:26 AM EST ----- Please let the patient know Moderate elevation of cholesterol panel. Added generic crestor at 5mg  which she should take every evening. Vitamin d was also low. I have started her on drisdol 50000ui weekly for the next several months. Both prescription sent to her pharmacy. Her other blood work results were good.

## 2018-01-26 ENCOUNTER — Telehealth: Payer: Self-pay | Admitting: Nurse Practitioner

## 2018-01-26 NOTE — Telephone Encounter (Signed)
LEFT MESSAGE TO RESCHEDULE 04/04/18 [email or web address].JW °

## 2018-04-04 ENCOUNTER — Ambulatory Visit: Payer: Self-pay | Admitting: Nurse Practitioner

## 2018-04-07 ENCOUNTER — Encounter: Payer: Self-pay | Admitting: Nurse Practitioner

## 2018-04-07 ENCOUNTER — Ambulatory Visit: Payer: BC Managed Care – PPO | Admitting: Nurse Practitioner

## 2018-04-07 VITALS — BP 140/82 | HR 84 | Resp 16 | Ht 59.0 in | Wt 132.0 lb

## 2018-04-07 DIAGNOSIS — E782 Mixed hyperlipidemia: Secondary | ICD-10-CM

## 2018-04-07 DIAGNOSIS — I10 Essential (primary) hypertension: Secondary | ICD-10-CM | POA: Diagnosis not present

## 2018-04-07 NOTE — Progress Notes (Signed)
[facility] [address] [place], [place] [address]  Internal MEDICINE  Office Visit Note  Patient Name: Barbara Cantrell  [id number]  [id number]  Date of Service: 04/07/2018  Chief Complaint  Patient presents with  . Medical Management of Chronic Issues    6 month follow up  . Hypertension  . Foot Pain    right    The patient is c/o right foot pain along the bottom of the foot. Hurts more after she has been sitting for a while and stands up. Will usually have to limp for a while and will get more tolerable after she has been up and around.  She also has tingling and numbness, intermittently, in both wrists and hands. Will bother her at night. Has to shake the hands to "wake them up." makes them feel swollen. No injuries. Has full ROM and strength in both hands at this time.  Started on crestor 5mg  every evening since her last visit. Labs did indicate moderate, generalized elevation of her lipid panel. Is tolerating this well. Needs to have levels and CMP rechecked. Blood pressure is well controlled.       Current Medication: Outpatient Encounter Medications as of 04/07/2018  Medication Sig Note  . amLODipine (NORVASC) 5 MG tablet TAKE 1 AND 1/2 TABLET BY MOUTH EVERY DAY FOR BLOOD PRESSURE   . ergocalciferol (DRISDOL) 1.25 MG (50000 UT) capsule Take 1 capsule (50,000 Units total) by mouth once a week.   . fluticasone (FLONASE) 50 MCG/ACT nasal spray Place 1 spray into both nostrils.   Marland Kitchen ibuprofen (ADVIL,MOTRIN) 600 MG tablet TAKE 1 TABLET(S) BY MOUTH THREE TIMES A DAY AS NEEDED FOR PAIN 02/11/2015: Received from: External Pharmacy  . montelukast (SINGULAIR) 10 MG tablet TAKE 1 TABLET BY MOUTH EVERY DAY FOR SINUSES   . rosuvastatin (CRESTOR) 5 MG tablet Take 1 tablet (5 mg total) by mouth daily.    No facility-administered encounter medications on file as of 04/07/2018.     Surgical History: Past Surgical History:  Procedure Laterality Date  . TUBAL LIGATION       Medical History: Past Medical History:  Diagnosis Date  . Constipation   . Hypertension   . Pelvic pain in female   . PMB (postmenopausal bleeding)   . Postmenopausal bleeding 02/11/2015    Family History: Family History  Problem Relation Age of Onset  . Ovarian cancer Sister   . Colon cancer Brother   . Asthma Maternal Aunt   . Diabetes Maternal Aunt   . Kidney disease Maternal Aunt   . Heart disease Neg Hx     Social History   Socioeconomic History  . Marital status: Married    Spouse name: Not on file  . Number of children: Not on file  . Years of education: Not on file  . Highest education level: Not on file  Occupational History  . Not on file  Social Needs  . Financial resource strain: Not on file  . Food insecurity:    Worry: Not on file    Inability: Not on file  . Transportation needs:    Medical: Not on file    Non-medical: Not on file  Tobacco Use  . Smoking status: Never Smoker  . Smokeless tobacco: Never Used  Substance and Sexual Activity  . Alcohol use: Yes    Comment: occas  . Drug use: No  . Sexual activity: Yes    Birth control/protection: Surgical  Lifestyle  . Physical activity:  Days per week: Not on file    Minutes per session: Not on file  . Stress: Not on file  Relationships  . Social connections:    Talks on phone: Not on file    Gets together: Not on file    Attends religious service: Not on file    Active member of club or organization: Not on file    Attends meetings of clubs or organizations: Not on file    Relationship status: Not on file  . Intimate partner violence:    Fear of current or ex partner: Not on file    Emotionally abused: Not on file    Physically abused: Not on file    Forced sexual activity: Not on file  Other Topics Concern  . Not on file  Social History Narrative  . Not on file      Review of Systems  Constitutional: Negative for activity change, chills, fatigue and unexpected weight  change.  HENT: Negative for congestion, postnasal drip, rhinorrhea, sneezing and sore throat.   Respiratory: Negative for cough, chest tightness and shortness of breath.   Cardiovascular: Negative for chest pain and palpitations.  Gastrointestinal: Negative for abdominal pain, constipation, diarrhea, nausea and vomiting.  Endocrine: Negative for cold intolerance, heat intolerance, polydipsia and polyuria.  Musculoskeletal: Negative for arthralgias, back pain, joint swelling and neck pain.  Skin: Negative for rash.  Allergic/Immunologic: Negative for environmental allergies.  Neurological: Negative for tremors, weakness, numbness and headaches.  Hematological: Negative for adenopathy. Does not bruise/bleed easily.  Psychiatric/Behavioral: Negative for behavioral problems (Depression), sleep disturbance and suicidal ideas. The patient is not nervous/anxious.    Today's Vitals   04/07/18 0853  BP: 140/82  Pulse: 84  Resp: 16  SpO2: 99%  Weight: 132 lb (59.9 kg)  Height: 4\' 11"  (1.499 m)   Body mass index is 26.66 kg/m.  Physical Exam Vitals signs and nursing note reviewed.  Constitutional:      General: She is not in acute distress.    Appearance: Normal appearance. She is well-developed. She is not diaphoretic.  HENT:     Head: Normocephalic and atraumatic.     Nose: Nose normal.     Mouth/Throat:     Pharynx: No oropharyngeal exudate.  Eyes:     Pupils: Pupils are equal, round, and reactive to light.  Neck:     Musculoskeletal: Normal range of motion and neck supple.     Thyroid: No thyromegaly.     Vascular: No carotid bruit or JVD.     Trachea: No tracheal deviation.  Cardiovascular:     Rate and Rhythm: Normal rate and regular rhythm.     Heart sounds: Normal heart sounds. No murmur. No friction rub. No gallop.   Pulmonary:     Effort: Pulmonary effort is normal. No respiratory distress.     Breath sounds: Normal breath sounds. No wheezing or rales.  Chest:      Chest wall: No tenderness.  Abdominal:     General: Bowel sounds are normal.     Palpations: Abdomen is soft.     Tenderness: There is no abdominal tenderness.  Musculoskeletal: Normal range of motion.  Lymphadenopathy:     Cervical: No cervical adenopathy.  Skin:    General: Skin is warm and dry.     Capillary Refill: Capillary refill takes less than 2 seconds.  Neurological:     Mental Status: She is alert and oriented to person, place, and time.  Cranial Nerves: No cranial nerve deficit.  Psychiatric:        Behavior: Behavior normal.        Thought Content: Thought content normal.        Judgment: Judgment normal.   Assessment/Plan: 1. Essential hypertension Stable. Continue bp medication as prescribed  2. Mixed hyperlipidemia Check fasting lipid panel and cmp. Adjust rosuvastatin as indicated .  General Counseling: Barbara Cantrell verbalizes understanding of the findings of todays visit and agrees with plan of treatment. I have discussed any further diagnostic evaluation that may be needed or ordered today. We also reviewed her medications today. she has been encouraged to call the office with any questions or concerns that should arise related to todays visit.  Hypertension Counseling:   The following hypertensive lifestyle modification were recommended and discussed:  1. Limiting alcohol intake to less than 1 oz/day of ethanol:(24 oz of beer or 8 oz of wine or 2 oz of 100-proof whiskey). 2. Take baby ASA 81 mg daily. 3. Importance of regular aerobic exercise and losing weight. 4. Reduce dietary saturated fat and cholesterol intake for overall cardiovascular health. 5. Maintaining adequate dietary potassium, calcium, and magnesium intake. 6. Regular monitoring of the blood pressure. 7. Reduce sodium intake to less than 100 mmol/day (less than 2.3 gm of sodium or less than 6 gm of sodium choride)   This patient was seen by Vincent GrosHeather Lanasia Porras FNP Collaboration with Dr Lyndon CodeFozia M Khan as a  part of collaborative care agreement   Time spent: 25 Minutes      Dr Lyndon CodeFozia M Khan Internal medicine

## 2018-04-17 NOTE — Patient Instructions (Signed)
Wrist and Forearm Exercises  Ask your health care provider which exercises are safe for you. Do exercises exactly as told by your health care provider and adjust them as directed. It is normal to feel mild stretching, pulling, tightness, or discomfort as you do these exercises, but you should stop right away if you feel sudden pain or your pain gets worse. Do not begin these exercises until told by your health care provider.  Range of motion exercises  These exercises warm up your muscles and joints and improve the movement and flexibility of your injured wrist and forearm. These exercises also help to relieve pain, numbness, and tingling. These exercises are done using the muscles in your injured wrist and forearm.  Exercise A: Wrist flexion, active  1. With your fingers relaxed, bend your wrist forward as far as you can.  2. Hold this position for __________ seconds.  Repeat __________ times. Complete this exercise __________ times a day.  Exercise B: Wrist extension, active  1. With your fingers relaxed, bend your wrist backward as far as you can.  2. Hold this position for __________ seconds.  Repeat __________ times. Complete this exercise __________ times a day.  Exercise C: Supination, active    1. Stand or sit with your arms at your sides.  2. Bend your left / right elbow to an "L" shape (90 degrees).  3. Turn your palm upward until you feel a gentle stretch on the inside of your forearm.  4. Hold this position for __________ seconds.  5. Slowly return your palm to the starting position.  Repeat __________ times. Complete this exercise __________ times a day.  Exercise D: Pronation, active    1. Stand or sit with your arms at your sides.  2. Bend your left / right elbow to an "L" shape (90 degrees).  3. Turn your palm downward until you feel a gentle stretch on the top of your forearm.  4. Hold this position for __________ seconds.  5. Slowly return your palm to the starting position.  Repeat __________  times. Complete this exercise __________ times a day.  Stretching  These exercises warm up your muscles and joints and improve the movement and flexibility of your injured wrist and forearm. These exercises also help to relieve pain, numbness, and tingling. These exercises are done using your healthy wrist and forearm to help stretch the muscles in your injured wrist and forearm.  Exercise E: Wrist flexion, passive    1. Extend your left / right arm in front of you, relax your wrist, and point your fingers downward.  2. Gently push on the back of your hand. Stop when you feel a gentle stretch on the top of your forearm.  3. Hold this position for __________ seconds.  Repeat __________ times. Complete this exercise __________ times a day.  Exercise F: Wrist extension, passive    1. Extend your left / right arm in front of you and turn your palm upward.  2. Gently pull your palm and fingertips back so your fingers point downward. You should feel a gentle stretch on the palm-side of your forearm.  3. Hold this position for __________ seconds.  Repeat __________ times. Complete this exercise __________ times a day.  Exercise G: Forearm rotation, supination, passive  1. Sit with your left / right elbow bent to an "L" shape (90 degrees) with your forearm resting on a table.  2. Keeping your upper body and shoulder still, use your other hand   to rotate your forearm palm-up until you feel a gentle to moderate stretch.  3. Hold this position for __________ seconds.  4. Slowly release the stretch and return to the starting position.  Repeat __________ times. Complete this exercise __________ times a day.  Exercise H: Forearm rotation, pronation, passive  1. Sit with your left / right elbow bent to an "L" shape (90 degrees) with your forearm resting on a table.  2. Keeping your upper body and shoulder still, use your other hand to rotate your forearm palm-down until you feel a gentle to moderate stretch.  3. Hold this position  for __________ seconds.  4. Slowly release the stretch and return to the starting position.  Repeat __________ times. Complete this exercise __________ times a day.  Strengthening exercises  These exercises build strength and endurance in your wrist and forearm. Endurance is the ability to use your muscles for a long time, even after they get tired.  Exercise I: Wrist flexors    1. Sit with your left / right forearm supported on a table and your hand resting palm-up over the edge of the table. Your elbow should be bent to an "L" shape (about 90 degrees) and be below the level of your shoulder.  2. Hold a __________ weight in your left / right hand. Or, hold a rubber exercise band or tube in both hands, keeping your hands at the same level and hip distance apart. There should be a slight tension in the exercise band or tube.  3. Slowly curl your hand up toward your forearm.  4. Hold this position for __________ seconds.  5. Slowly lower your hand back to the starting position.  Repeat __________ times. Complete this exercise __________ times a day.  Exercise J: Wrist extensors    1. Sit with your left / right forearm supported on a table and your hand resting palm-down over the edge of the table. Your elbow should be bent to an "L" shape (about 90 degrees) and be below the level of your shoulder.  2. Hold a __________ weight in your left / right hand. Or, hold a rubber exercise band or tube in both hands, keeping your hands at the same level and hip distance apart. There should be a slight tension in the exercise band or tube.  3. Slowly curl your hand up toward your forearm.  4. Hold this position for __________ seconds.  5. Slowly lower your hand back to the starting position.  Repeat __________ times. Complete this exercise __________ times a day.  Exercise K: Forearm rotation, supination    1. Sit with your left / right forearm supported on a table and your hand resting palm-down. Your elbow should be at your  side, bent to an "L" shape (about 90 degrees), and below the level of your shoulder. Keep your wrist stable and in a neutral position throughout the exercise.  2. Gently hold a lightweight hammer with your left / right hand.  3. Without moving your elbow or wrist, slowly rotate your palm upward to a thumbs-up position.  4. Hold this position for __________ seconds.  5. Slowly return your forearm to the starting position.  Repeat __________ times. Complete this exercise __________ times a day.  Exercise L: Forearm rotation, pronation    1. Sit with your left / right forearm supported on a table and your hand resting palm-up. Your elbow should be at your side, bent to an "L" shape (about 90 degrees), and   below the level of your shoulder. Keep your wrist stable. Do not allow it to move backward or forward during the exercise.  2. Gently hold a lightweight hammer with your left / right hand.  3. Without moving your elbow or wrist, slowly rotate your palm and hand upward to a thumbs-up position.  4. Hold this position for __________ seconds.  5. Slowly return your forearm to the starting position.  Repeat __________ times. Complete this exercise __________ times a day.  Exercise M: Grip strengthening    1. Hold one of these items in your left / right hand: play dough, therapy putty, a dense sponge, a stress ball, or a large, rolled sock.  2. Squeeze as hard as you can without increasing pain.  3. Hold this position for __________ seconds.  4. Slowly release your grip.  Repeat __________ times. Complete this exercise __________ times a day.  This information is not intended to replace advice given to you by your health care provider. Make sure you discuss any questions you have with your health care provider.  Document Released: 12/09/2004 Document Revised: 05/31/2017 Document Reviewed: 10/20/2014  [employer] Interactive Patient Education © 2019 [employer]

## 2018-04-27 DIAGNOSIS — Z1239 Encounter for other screening for malignant neoplasm of breast: Secondary | ICD-10-CM | POA: Diagnosis not present

## 2018-04-27 DIAGNOSIS — Z1231 Encounter for screening mammogram for malignant neoplasm of breast: Secondary | ICD-10-CM | POA: Diagnosis not present

## 2018-05-16 ENCOUNTER — Other Ambulatory Visit: Payer: Self-pay

## 2018-05-16 DIAGNOSIS — E782 Mixed hyperlipidemia: Secondary | ICD-10-CM

## 2018-05-16 MED ORDER — ROSUVASTATIN CALCIUM 5 MG PO TABS: 5.0000 mg | ORAL_TABLET | Freq: Every day | ORAL | 3 refills | Status: DC

## 2018-05-31 ENCOUNTER — Other Ambulatory Visit: Payer: Self-pay

## 2018-05-31 MED ORDER — AMLODIPINE BESYLATE 5 MG PO TABS: ORAL_TABLET | ORAL | 3 refills | Status: DC

## 2018-07-04 ENCOUNTER — Other Ambulatory Visit: Payer: Self-pay

## 2018-07-04 DIAGNOSIS — E559 Vitamin D deficiency, unspecified: Secondary | ICD-10-CM

## 2018-07-04 MED ORDER — ERGOCALCIFEROL 1.25 MG (50000 UT) PO CAPS: 50000.0000 [IU] | ORAL_CAPSULE | ORAL | 1 refills | Status: DC

## 2018-08-23 ENCOUNTER — Other Ambulatory Visit: Payer: Self-pay

## 2018-08-23 DIAGNOSIS — E559 Vitamin D deficiency, unspecified: Secondary | ICD-10-CM

## 2018-08-23 MED ORDER — ERGOCALCIFEROL 1.25 MG (50000 UT) PO CAPS: 50000.0000 [IU] | ORAL_CAPSULE | ORAL | 1 refills | Status: DC

## 2018-09-25 ENCOUNTER — Other Ambulatory Visit: Payer: Self-pay | Admitting: Nurse Practitioner

## 2018-09-25 DIAGNOSIS — E782 Mixed hyperlipidemia: Secondary | ICD-10-CM

## 2018-09-25 MED ORDER — AMLODIPINE BESYLATE 5 MG PO TABS: ORAL_TABLET | ORAL | 3 refills | Status: DC

## 2018-09-25 MED ORDER — ROSUVASTATIN CALCIUM 5 MG PO TABS: 5.0000 mg | ORAL_TABLET | Freq: Every day | ORAL | 3 refills | Status: DC

## 2018-10-06 ENCOUNTER — Ambulatory Visit: Payer: Self-pay | Admitting: Nurse Practitioner

## 2018-10-13 ENCOUNTER — Telehealth: Payer: Self-pay | Admitting: Nurse Practitioner

## 2018-10-18 NOTE — Telephone Encounter (Signed)
I'm bringing you lab slip. She can pick it up or we can mail it to her.

## 2018-10-24 ENCOUNTER — Other Ambulatory Visit: Payer: Self-pay

## 2018-10-24 DIAGNOSIS — E559 Vitamin D deficiency, unspecified: Secondary | ICD-10-CM

## 2018-10-24 MED ORDER — ERGOCALCIFEROL 1.25 MG (50000 UT) PO CAPS: 50000.0000 [IU] | ORAL_CAPSULE | ORAL | 0 refills | Status: DC

## 2018-11-06 ENCOUNTER — Other Ambulatory Visit: Payer: Self-pay | Admitting: Nurse Practitioner

## 2018-11-06 DIAGNOSIS — I10 Essential (primary) hypertension: Secondary | ICD-10-CM | POA: Diagnosis not present

## 2018-11-06 DIAGNOSIS — E782 Mixed hyperlipidemia: Secondary | ICD-10-CM | POA: Diagnosis not present

## 2018-11-06 DIAGNOSIS — E559 Vitamin D deficiency, unspecified: Secondary | ICD-10-CM | POA: Diagnosis not present

## 2018-11-06 DIAGNOSIS — Z0001 Encounter for general adult medical examination with abnormal findings: Secondary | ICD-10-CM | POA: Diagnosis not present

## 2018-11-07 LAB — CBC
Hematocrit: 40.5 % (ref 34.0–46.6)
Hemoglobin: 13.2 g/dL (ref 11.1–15.9)
MCH: 28.1 pg (ref 26.6–33.0)
MCHC: 32.6 g/dL (ref 31.5–35.7)
MCV: 86 fL (ref 79–97)
Platelets: 344 10*3/uL (ref 150–450)
RBC: 4.69 x10E6/uL (ref 3.77–5.28)
RDW: 13.8 % (ref 11.7–15.4)
WBC: 7.9 10*3/uL (ref 3.4–10.8)

## 2018-11-07 LAB — LIPID PANEL W/O CHOL/HDL RATIO
Cholesterol, Total: 203 mg/dL — ABNORMAL HIGH (ref 100–199)
HDL: 45 mg/dL (ref >39)
LDL Chol Calc (NIH): 128 mg/dL — ABNORMAL HIGH (ref 0–99)
Triglycerides: 167 mg/dL — ABNORMAL HIGH (ref 0–149)
VLDL Cholesterol Cal: 30 mg/dL (ref 5–40)

## 2018-11-07 LAB — COMPREHENSIVE METABOLIC PANEL
ALT: 16 IU/L (ref 0–32)
AST: 16 IU/L (ref 0–40)
Albumin/Globulin Ratio: 1.5 (ref 1.2–2.2)
Albumin: 4.7 g/dL (ref 3.8–4.9)
Alkaline Phosphatase: 69 IU/L (ref 39–117)
BUN/Creatinine Ratio: 16 (ref 9–23)
BUN: 12 mg/dL (ref 6–24)
Bilirubin Total: 0.3 mg/dL (ref 0.0–1.2)
CO2: 21 mmol/L (ref 20–29)
Calcium: 9.9 mg/dL (ref 8.7–10.2)
Chloride: 104 mmol/L (ref 96–106)
Creatinine, Ser: 0.77 mg/dL (ref 0.57–1.00)
GFR calc Af Amer: 101 mL/min/{1.73_m2} (ref >59)
GFR calc non Af Amer: 88 mL/min/{1.73_m2} (ref >59)
Globulin, Total: 3.2 g/dL (ref 1.5–4.5)
Glucose: 109 mg/dL — ABNORMAL HIGH (ref 65–99)
Potassium: 4.3 mmol/L (ref 3.5–5.2)
Sodium: 140 mmol/L (ref 134–144)
Total Protein: 7.9 g/dL (ref 6.0–8.5)

## 2018-11-07 LAB — VITAMIN D 25 HYDROXY (VIT D DEFICIENCY, FRACTURES): Vit D, 25-Hydroxy: 52 ng/mL (ref 30.0–100.0)

## 2018-11-07 LAB — TSH: TSH: 0.998 u[IU]/mL (ref 0.450–4.500)

## 2018-11-07 LAB — T4, FREE: Free T4: 1.01 ng/dL (ref 0.82–1.77)

## 2018-11-14 ENCOUNTER — Ambulatory Visit: Payer: BC Managed Care – PPO | Admitting: Nurse Practitioner

## 2018-11-14 ENCOUNTER — Encounter: Payer: Self-pay | Admitting: Nurse Practitioner

## 2018-11-14 ENCOUNTER — Other Ambulatory Visit: Payer: Self-pay

## 2018-11-14 VITALS — BP 134/88 | HR 81 | Temp 97.3°F | Resp 16 | Ht 59.0 in | Wt 137.0 lb

## 2018-11-14 DIAGNOSIS — M5441 Lumbago with sciatica, right side: Secondary | ICD-10-CM

## 2018-11-14 DIAGNOSIS — Z1211 Encounter for screening for malignant neoplasm of colon: Secondary | ICD-10-CM | POA: Insufficient documentation

## 2018-11-14 DIAGNOSIS — I10 Essential (primary) hypertension: Secondary | ICD-10-CM | POA: Diagnosis not present

## 2018-11-14 DIAGNOSIS — R0683 Snoring: Secondary | ICD-10-CM

## 2018-11-14 DIAGNOSIS — G4719 Other hypersomnia: Secondary | ICD-10-CM

## 2018-11-14 DIAGNOSIS — E782 Mixed hyperlipidemia: Secondary | ICD-10-CM | POA: Diagnosis not present

## 2018-11-14 DIAGNOSIS — Z800 Family history of malignant neoplasm of digestive organs: Secondary | ICD-10-CM

## 2018-11-14 MED ORDER — METHYLPREDNISOLONE 4 MG PO TBPK: ORAL_TABLET | ORAL | 0 refills | Status: DC

## 2018-11-14 NOTE — Progress Notes (Signed)
[facility] [place], [place] [address]  Internal MEDICINE  Office Visit Note  Patient Name: Barbara Cantrell  [id number]  [id number]  Date of Service: 11/14/2018  Chief Complaint  Patient presents with  . Follow-up    recheck vit d levels  . Hypertension  . Quality Metric Gaps    mammogram and colonoscopy    The patient is here for routine follow up visit. She is having low back pain which radiates into the right hip and down the right leg. Has taken ibuprofen for this which has really not helped. She states that she did "slip a disk" when she was in her teens. Acts up every once in a while. Does exercises at home and plans to see a chiropractor.  She states that she snores very loudly. She states that her daughter told her she "sounds like a cow" when she sleeps. She keeps the whole house awake. She states that she has even heard herself snore. She does not wake up with headache. She has moderate sleepiness during the day. She has never fallen asleep at the wheel or anything like that.  Had labs done since her last visit. Cholesterol panel mildly elevated, much improved from check last year.  She is due to have screening colonoscopy. She was recommended to have this every five years. Has strong family history of colon cancer.       Current Medication: Outpatient Encounter Medications as of 11/14/2018  Medication Sig Note  . amLODipine (NORVASC) 5 MG tablet TAKE 1 AND 1/2 TABLET BY MOUTH EVERY DAY FOR BLOOD PRESSURE   . ergocalciferol (DRISDOL) 1.25 MG (50000 UT) capsule Take 1 capsule (50,000 Units total) by mouth once a week.   . fluticasone (FLONASE) 50 MCG/ACT nasal spray Place 1 spray into both nostrils.   Marland Kitchen ibuprofen (ADVIL,MOTRIN) 600 MG tablet TAKE 1 TABLET(S) BY MOUTH THREE TIMES A DAY AS NEEDED FOR PAIN 02/11/2015: Received from: External Pharmacy  . montelukast (SINGULAIR) 10 MG tablet TAKE 1 TABLET BY MOUTH EVERY DAY FOR SINUSES   . rosuvastatin  (CRESTOR) 5 MG tablet Take 1 tablet (5 mg total) by mouth daily.   . methylPREDNISolone (MEDROL) 4 MG TBPK tablet Take by mouth as directed for 6 days    No facility-administered encounter medications on file as of 11/14/2018.     Surgical History: Past Surgical History:  Procedure Laterality Date  . TUBAL LIGATION      Medical History: Past Medical History:  Diagnosis Date  . Constipation   . Hypertension   . Pelvic pain in female   . PMB (postmenopausal bleeding)   . Postmenopausal bleeding 02/11/2015    Family History: Family History  Problem Relation Age of Onset  . Ovarian cancer Sister   . Colon cancer Brother   . Asthma Maternal Aunt   . Diabetes Maternal Aunt   . Kidney disease Maternal Aunt   . Heart disease Neg Hx     Social History   Socioeconomic History  . Marital status: Married    Spouse name: Not on file  . Number of children: Not on file  . Years of education: Not on file  . Highest education level: Not on file  Occupational History  . Not on file  Social Needs  . Financial resource strain: Not on file  . Food insecurity    Worry: Not on file    Inability: Not on file  . Transportation needs    Medical: Not  on file    Non-medical: Not on file  Tobacco Use  . Smoking status: Never Smoker  . Smokeless tobacco: Never Used  Substance and Sexual Activity  . Alcohol use: Yes    Comment: occas  . Drug use: No  . Sexual activity: Yes    Birth control/protection: Surgical  Lifestyle  . Physical activity    Days per week: Not on file    Minutes per session: Not on file  . Stress: Not on file  Relationships  . Social Musician on phone: Not on file    Gets together: Not on file    Attends religious service: Not on file    Active member of club or organization: Not on file    Attends meetings of clubs or organizations: Not on file    Relationship status: Not on file  . Intimate partner violence    Fear of current or ex partner:  Not on file    Emotionally abused: Not on file    Physically abused: Not on file    Forced sexual activity: Not on file  Other Topics Concern  . Not on file  Social History Narrative  . Not on file      Review of Systems  Constitutional: Negative for activity change, chills, fatigue and unexpected weight change.  HENT: Negative for congestion, postnasal drip, rhinorrhea, sneezing and sore throat.   Respiratory: Negative for cough, chest tightness and shortness of breath.   Cardiovascular: Negative for chest pain and palpitations.  Gastrointestinal: Negative for abdominal pain, constipation, diarrhea, nausea and vomiting.  Endocrine: Negative for cold intolerance, heat intolerance, polydipsia and polyuria.  Musculoskeletal: Positive for back pain. Negative for arthralgias, joint swelling and neck pain.       Low back pain which is radiating into the right hip and leg.   Skin: Negative for rash.  Allergic/Immunologic: Negative for environmental allergies.  Neurological: Negative for tremors, weakness, numbness and headaches.  Hematological: Negative for adenopathy. Does not bruise/bleed easily.  Psychiatric/Behavioral: Negative for behavioral problems (Depression), sleep disturbance and suicidal ideas. The patient is not nervous/anxious.     Today's Vitals   11/14/18 1027  BP: 134/88  Pulse: 81  Resp: 16  Temp: (!) 97.3 F (36.3 C)  SpO2: 98%  Weight: 137 lb (62.1 kg)  Height: 4\' 11"  (1.499 m)   Body mass index is 27.67 kg/m.  Physical Exam Vitals signs and nursing note reviewed.  Constitutional:      General: She is not in acute distress.    Appearance: Normal appearance. She is well-developed. She is not diaphoretic.  HENT:     Head: Normocephalic and atraumatic.     Mouth/Throat:     Pharynx: No oropharyngeal exudate.  Eyes:     Pupils: Pupils are equal, round, and reactive to light.  Neck:     Musculoskeletal: Normal range of motion and neck supple.      Thyroid: No thyromegaly.     Vascular: No carotid bruit or JVD.     Trachea: No tracheal deviation.  Cardiovascular:     Rate and Rhythm: Normal rate and regular rhythm.     Heart sounds: Normal heart sounds. No murmur. No friction rub. No gallop.   Pulmonary:     Effort: Pulmonary effort is normal. No respiratory distress.     Breath sounds: Normal breath sounds. No wheezing or rales.  Chest:     Chest wall: No tenderness.  Abdominal:  Palpations: Abdomen is soft.  Musculoskeletal: Normal range of motion.     Comments: Moderate lower back pain which is worse with bending and twisting at the waist. Radiates into the right hip. No visible abnormalities noted at this time.   Lymphadenopathy:     Cervical: No cervical adenopathy.  Skin:    General: Skin is warm and dry.  Neurological:     Mental Status: She is alert and oriented to person, place, and time.     Cranial Nerves: No cranial nerve deficit.  Psychiatric:        Behavior: Behavior normal.        Thought Content: Thought content normal.        Judgment: Judgment normal.   Assessment/Plan: 1. Acute right-sided low back pain with right-sided sciatica Add medrol dose pack. Take as directed for 6 days. contineu using ibuprofen as needed to reduce pain and inflammation. Exercises to help reduce sciatica. Consider visiting chiropractor.  - methylPREDNISolone (MEDROL) 4 MG TBPK tablet; Take by mouth as directed for 6 days  Dispense: 21 tablet; Refill: 0  2. Essential hypertension Stable. Continue bp medication as prescribed   3. Mixed hyperlipidemia Improved. Continue crestor as prescribed   4. Loud snoring Home sleep study ordered.  - Home sleep test  5. Excessive daytime sleepiness Home sleep study ordered.  - Home sleep test  6. Family history of colon cancer [facility].  - Ambulatory referral to Gastroenterology  7. Screening for colon cancer [facility].  - Ambulatory referral to Gastroenterology  General Counseling: Lily PeerNancy verbalizes understanding of the findings of todays visit and agrees with plan of treatment. I have discussed any further diagnostic evaluation that may be needed or ordered today. We also reviewed her medications today. she has been encouraged to call the office with any questions or concerns that should arise related to todays visit.  Patient has sign and symptoms of OSA ( disturbed sleep, excessive fatigue during the day, uncontrolled bp and abnormal BMI). Baseline sleep study is ordered to further look into this. Long term complications of OSA was addressed with the patient.  This patient was seen by Vincent GrosHeather Kerrington Sova FNP Collaboration with Dr Lyndon CodeFozia M Khan as a part of collaborative care agreement  Orders Placed This Encounter  Procedures  . Ambulatory referral to Gastroenterology  . Home sleep test    Meds ordered this encounter  Medications  . methylPREDNISolone (MEDROL) 4 MG TBPK tablet    Sig: Take by mouth as directed for 6 days    Dispense:  21 tablet    Refill:  0    Order Specific Question:   Supervising Provider    Answer:   Lyndon CodeKHAN, FOZIA M [[id number]]    Time spent: 6325 Minutes      Dr Lyndon CodeFozia M Khan Internal medicine

## 2018-11-20 ENCOUNTER — Other Ambulatory Visit: Payer: Self-pay

## 2018-11-20 DIAGNOSIS — E559 Vitamin D deficiency, unspecified: Secondary | ICD-10-CM

## 2018-11-20 MED ORDER — ERGOCALCIFEROL 1.25 MG (50000 UT) PO CAPS: 50000.0000 [IU] | ORAL_CAPSULE | ORAL | 3 refills | Status: DC

## 2018-11-23 ENCOUNTER — Ambulatory Visit: Payer: BC Managed Care – PPO | Admitting: Internal Medicine

## 2018-11-23 ENCOUNTER — Other Ambulatory Visit: Payer: Self-pay

## 2018-11-23 DIAGNOSIS — G4733 Obstructive sleep apnea (adult) (pediatric): Secondary | ICD-10-CM | POA: Diagnosis not present

## 2018-11-27 ENCOUNTER — Other Ambulatory Visit: Payer: Self-pay

## 2018-11-27 ENCOUNTER — Ambulatory Visit: Payer: BC Managed Care – PPO | Admitting: Internal Medicine

## 2018-11-27 ENCOUNTER — Encounter: Payer: Self-pay | Admitting: Internal Medicine

## 2018-11-27 VITALS — BP 130/80 | HR 83 | Temp 98.1°F | Resp 16 | Ht 59.0 in | Wt 140.0 lb

## 2018-11-27 DIAGNOSIS — I10 Essential (primary) hypertension: Secondary | ICD-10-CM | POA: Diagnosis not present

## 2018-11-27 DIAGNOSIS — G4733 Obstructive sleep apnea (adult) (pediatric): Secondary | ICD-10-CM | POA: Diagnosis not present

## 2018-11-27 NOTE — Progress Notes (Signed)
[facility] [place], [place] [address]  Pulmonary Sleep Medicine   Office Visit Note  Patient Name: Barbara Cantrell DOB: 1965/06/11 MRN [id number]  Date of Service: 11/27/2018  Complaints/HPI: Pt is here for follow up on home sleep study.  Her sleep study shows an overall ahi of 20.9.  She had 170 Hypopneas, 2 central apneas, and 34 obstructive apneas. She will need a titration study to determine the appropriate pressure for cpap therapy.   ROS  General: (-) fever, (-) chills, (-) night sweats, (-) weakness Skin: (-) rashes, (-) itching,. Eyes: (-) visual changes, (-) redness, (-) itching. Nose and Sinuses: (-) nasal stuffiness or itchiness, (-) postnasal drip, (-) nosebleeds, (-) sinus trouble. Mouth and Throat: (-) sore throat, (-) hoarseness. Neck: (-) swollen glands, (-) enlarged thyroid, (-) neck pain. Respiratory: - cough, (-) bloody sputum, - shortness of breath, - wheezing. Cardiovascular: - ankle swelling, (-) chest pain. Lymphatic: (-) lymph node enlargement. Neurologic: (-) numbness, (-) tingling. Psychiatric: (-) anxiety, (-) depression   Current Medication: Outpatient Encounter Medications as of 11/27/2018  Medication Sig Note  . amLODipine (NORVASC) 5 MG tablet TAKE 1 AND 1/2 TABLET BY MOUTH EVERY DAY FOR BLOOD PRESSURE   . ergocalciferol (DRISDOL) 1.25 MG (50000 UT) capsule Take 1 capsule (50,000 Units total) by mouth once a week.   . fluticasone (FLONASE) 50 MCG/ACT nasal spray Place 1 spray into both nostrils.   Marland Kitchen ibuprofen (ADVIL,MOTRIN) 600 MG tablet TAKE 1 TABLET(S) BY MOUTH THREE TIMES A DAY AS NEEDED FOR PAIN 02/11/2015: Received from: External Pharmacy  . methylPREDNISolone (MEDROL) 4 MG TBPK tablet Take by mouth as directed for 6 days   . montelukast (SINGULAIR) 10 MG tablet TAKE 1 TABLET BY MOUTH EVERY DAY FOR SINUSES   . rosuvastatin (CRESTOR) 5 MG tablet Take 1 tablet (5 mg total) by mouth daily.    No facility-administered  encounter medications on file as of 11/27/2018.     Surgical History: Past Surgical History:  Procedure Laterality Date  . TUBAL LIGATION      Medical History: Past Medical History:  Diagnosis Date  . Constipation   . Hypertension   . Pelvic pain in female   . PMB (postmenopausal bleeding)   . Postmenopausal bleeding 02/11/2015    Family History: Family History  Problem Relation Age of Onset  . Ovarian cancer Sister   . Colon cancer Brother   . Asthma Maternal Aunt   . Diabetes Maternal Aunt   . Kidney disease Maternal Aunt   . Heart disease Neg Hx     Social History: Social History   Socioeconomic History  . Marital status: Married    Spouse name: Not on file  . Number of children: Not on file  . Years of education: Not on file  . Highest education level: Not on file  Occupational History  . Not on file  Social Needs  . Financial resource strain: Not on file  . Food insecurity    Worry: Not on file    Inability: Not on file  . Transportation needs    Medical: Not on file    Non-medical: Not on file  Tobacco Use  . Smoking status: Never Smoker  . Smokeless tobacco: Never Used  Substance and Sexual Activity  . Alcohol use: Yes    Comment: occas  . Drug use: No  . Sexual activity: Yes    Birth control/protection: Surgical  Lifestyle  . Physical activity  Days per week: Not on file    Minutes per session: Not on file  . Stress: Not on file  Relationships  . Social Musician on phone: Not on file    Gets together: Not on file    Attends religious service: Not on file    Active member of club or organization: Not on file    Attends meetings of clubs or organizations: Not on file    Relationship status: Not on file  . Intimate partner violence    Fear of current or ex partner: Not on file    Emotionally abused: Not on file    Physically abused: Not on file    Forced sexual activity: Not on file  Other Topics Concern  . Not on file   Social History Narrative  . Not on file    Vital Signs: Blood pressure 130/80, pulse 83, temperature 98.1 F (36.7 C), resp. rate 16, height  (1.499 m), weight 140 lb (63.5 kg), last menstrual period 01/09/2015, SpO2 98 %.  Examination: General Appearance: The patient is well-developed, well-nourished, and in no distress. Skin: Gross inspection of skin unremarkable. Head: normocephalic, no gross deformities. Eyes: no gross deformities noted. ENT: ears appear grossly normal no exudates. Neck: Supple. No thyromegaly. No LAD. Respiratory: clear bilaterally. Cardiovascular: Normal S1 and S2 without murmur or rub. Extremities: No cyanosis. pulses are equal. Neurologic: Alert and oriented. No involuntary movements.  LABS: Recent Results (from the past 2160 hour(s))  Comprehensive metabolic panel     Status: Abnormal   Collection Time: 11/06/18  9:55 AM  Result Value Ref Range   Glucose 109 (H) 65 - 99 mg/dL   BUN 12 6 - 24 mg/dL   Creatinine, Ser 4.16 0.57 - 1.00 mg/dL   GFR calc non Af Amer 88 >59 mL/min/1.73   GFR calc Af Amer 101 >59 mL/min/1.73   BUN/Creatinine Ratio 16 9 - 23   Sodium 140 134 - 144 mmol/L   Potassium 4.3 3.5 - 5.2 mmol/L   Chloride 104 96 - 106 mmol/L   CO2 21 20 - 29 mmol/L   Calcium 9.9 8.7 - 10.2 mg/dL   Total Protein 7.9 6.0 - 8.5 g/dL   Albumin 4.7 3.8 - 4.9 g/dL   Globulin, Total 3.2 1.5 - 4.5 g/dL   Albumin/Globulin Ratio 1.5 1.2 - 2.2   Bilirubin Total 0.3 0.0 - 1.2 mg/dL   Alkaline Phosphatase 69 39 - 117 IU/L   AST 16 0 - 40 IU/L   ALT 16 0 - 32 IU/L  CBC     Status: None   Collection Time: 11/06/18  9:55 AM  Result Value Ref Range   WBC 7.9 3.4 - 10.8 x10E3/uL   RBC 4.69 3.77 - 5.28 x10E6/uL   Hemoglobin 13.2 11.1 - 15.9 g/dL   Hematocrit 60.6 30.1 - 46.6 %   MCV 86 79 - 97 fL   MCH 28.1 26.6 - 33.0 pg   MCHC 32.6 31.5 - 35.7 g/dL   RDW 60.1 09.3 - 23.5 %   Platelets 344 150 - 450 x10E3/uL  Lipid Panel w/o Chol/HDL Ratio      Status: Abnormal   Collection Time: 11/06/18  9:55 AM  Result Value Ref Range   Cholesterol, Total 203 (H) 100 - 199 mg/dL   Triglycerides 573 (H) 0 - 149 mg/dL   HDL 45 >22 mg/dL   VLDL Cholesterol Cal 30 5 - 40 mg/dL   LDL Chol Calc (  NIH) 128 (H) 0 - 99 mg/dL  T4, free     Status: None   Collection Time: 11/06/18  9:55 AM  Result Value Ref Range   Free T4 1.01 0.82 - 1.77 ng/dL  TSH     Status: None   Collection Time: 11/06/18  9:55 AM  Result Value Ref Range   TSH 0.998 0.450 - 4.500 uIU/mL  VITAMIN D 25 Hydroxy (Vit-D Deficiency, Fractures)     Status: None   Collection Time: 11/06/18  9:55 AM  Result Value Ref Range   Vit D, 25-Hydroxy 52.0 30.0 - 100.0 ng/mL    Comment: Vitamin D deficiency has been defined by the Institute of Medicine and an Endocrine Society practice guideline as a level of serum 25-OH vitamin D less than 20 ng/mL (1,2). The Endocrine Society went on to further define vitamin D insufficiency as a level between 21 and 29 ng/mL (2). 1. IOM (Institute of Medicine). 2010. Dietary reference    intakes for calcium and D. [place]: The    [employer]. 2. Holick MF, Binkley Peyton, Bischoff-Ferrari HA, et al.    Evaluation, treatment, and prevention of vitamin D    deficiency: an Endocrine Society clinical practice    guideline. JCEM. 2011 Jul; 96(7):1911-30.     Radiology: Dg Cervical Spine Complete  Result Date: 09/04/2015 CLINICAL DATA:  Right neck pain radiating to right shoulder. No known injury. Symptoms for 6 months to 1 year. EXAM: CERVICAL SPINE - COMPLETE 4+ VIEW COMPARISON:  None. FINDINGS: Normal alignment. Disc spaces are maintained. Prevertebral soft tissues are normal. No neural foraminal narrowing. Early degenerative facet disease bilaterally. IMPRESSION: No acute bony abnormality. Electronically Signed   By: Charlett Nose M.D.   On: 09/04/2015 14:52  Dg Lumbar Spine Complete  Result Date: 09/04/2015 CLINICAL DATA:  Low  right-sided back pain radiates to right leg for 6 months. No known injury. EXAM: LUMBAR SPINE - COMPLETE 4+ VIEW COMPARISON:  None. FINDINGS: There is no evidence of lumbar spine fracture. Alignment is normal. Intervertebral disc spaces are maintained. IMPRESSION: Negative. Electronically Signed   By: Kennith Center M.D.   On: 09/04/2015 14:52   No results found.  No results found.    Assessment and Plan: Patient Active Problem List   Diagnosis Date Noted  . Acute right-sided low back pain with right-sided sciatica 11/14/2018  . Loud snoring 11/14/2018  . Excessive daytime sleepiness 11/14/2018  . Screening for colon cancer 11/14/2018  . Essential hypertension 03/28/2017  . Mixed hyperlipidemia 03/28/2017  . Vitamin D deficiency 03/28/2017  . Routine cervical smear 03/28/2017  . Postmenopausal bleeding 02/11/2015  . Family history of breast cancer 02/11/2015  . Family history of ovarian cancer 02/11/2015  . Family history of colon cancer 02/11/2015    1. OSA (obstructive sleep apnea) Titration study ordered to evaluate appropriate pressure for cpap machine.  - Cpap titration; Future  2. Essential hypertension Stable, continue present management.   General Counseling: I have discussed the findings of the evaluation and examination with Harriett Sine.  I have also discussed any further diagnostic evaluation thatmay be needed or ordered today. Harriet verbalizes understanding of the findings of todays visit. We also reviewed her medications today and discussed drug interactions and side effects including but not limited excessive drowsiness and altered mental states. We also discussed that there is always a risk not just to her but also people around her. she has been encouraged to call the office with any questions or  concerns that should arise related to todays visit.    Time spent: 25 This patient was seen by Blima LedgerAdam Matix Henshaw AGNP-C in Collaboration with Dr. Freda MunroSaadat Khan as a part of  collaborative care agreement.   I have personally obtained a history, examined the patient, evaluated laboratory and imaging results, formulated the assessment and plan and placed orders.    Yevonne PaxSaadat A Khan, MD [facility] Sleep medicine

## 2018-12-04 NOTE — Procedures (Signed)
[facility] [place], [place] [address]  Sleep Specialist: Allyne Gee, MD [place] Sleep Study Interpretation  Patient Name: Barbara Cantrell Patient MR [id number] DOB:05-Apr-1965  Date of Study: 10/15 2020  Indications for study: Hypersomnia sleep apnea  BMI: 27.6 kg/m       Respiratory Data:  Total AHI: 20.9/h  Total Obstructive Apneas: 34  Total Central Apneas: 2  Total Mixed Apneas: 0  Total Hypopneas: 170  If the AHI is greater than 5 per hour patient qualifies for PAP evaluation  Oximetry Data:  Oxygen Desaturation Index: 26.5/h  Lowest Desaturation: 77%  Cardiac Data:  Minimum Heart Rate: 55  Maximum Heart Rate: 150   Impression / Diagnosis:  This apnea study is consistent with moderate obstructive sleep apnea.  Patient has significant oxygen desaturation down to 77%.  Patient also had significant arrhythmias with tachycardia having been noted as above.  Would recommend doing a in lab CPAP titration study to determine the optimal response to therapy.  Correlation is recommended  GENERAL Recommendations:  1.  Consider Auto PAP with pressure ranges 5-20 cmH20 with download, or facility based PAP Titration Study  2.  Consider PAP interface mask fitted for patient comfort, Heated Humidification & PAP compliance monitoring (1 month, 3 months & 12 months after PAP initiation)  3. Consider treatment with mandibular advancement splint (MAS) or referral to an ENT surgeon for modification to the upper airway if the patient prefers an alternate therapy or the PAP trial is unsuccessful  4. Sleep hygiene measures should be discussed with the patient  5. Behavioral therapy such as weight reduction or smoking cessation as appropriate for the patient  6. Advise patient against the use of alcohol or sedatives in so much as these substances can worsen excessive daytime sleepiness and respiratory disturbances of sleep  7. Advise  patient against participating in potentially dangerous activities while drowsy such as operating a motor vehicle, heavy equipment or power tools as it can put them and others in danger  8. Advise patient of the long term consequences of OSA if left untreated, need for treatment and close follow up  9. Clinical follow up as deemed necessary     This Level III home sleep study was performed using the [employer], a 4 channel screening device subject to limitations. Depending on actual total sleep time, not measured in this study, the AHI (sum of apneas and hypopneas/hr of sleep) and therefore the severity of sleep apnea may be underestimated. As with any single night study, including Level 1 attended PSG, severity of sleep apnea may also be underestimated due to the lack of supine and/or REM sleep.  The interpretation associated with this report is based on normal values and degrees of severity in accordance with AASM parameters and/or estimated from multiple sources in the literature for adults ages 49-80+. These may not agree with the displayed values. The patient's treating physician should use the interpretation and recommendations in conjunction with the overall clinical evaluation and treatment of the patient.  Some of the terminology used in this scored [facility]nea[facility] report was developed several years ago and may not always be in accordance with current nomenclature. This in no way affects the accuracy of the data or the reliability of the interpretation and recommendations.

## 2018-12-05 ENCOUNTER — Ambulatory Visit (INDEPENDENT_AMBULATORY_CARE_PROVIDER_SITE_OTHER): Payer: BC Managed Care – PPO | Admitting: Internal Medicine

## 2018-12-05 DIAGNOSIS — G4733 Obstructive sleep apnea (adult) (pediatric): Secondary | ICD-10-CM | POA: Diagnosis not present

## 2018-12-10 NOTE — Progress Notes (Signed)
Positive for sleep apnea. Follow up with Dr. Humphrey Rolls as scheduled.

## 2018-12-14 NOTE — Procedures (Signed)
[facility] [address] [place], [place] [address]  Patient Name: Barbara Cantrell DOB: 1965-06-04   SLEEP STUDY INTERPRETATION  DATE OF SERVICE: December 05, 2018   SLEEP STUDY HISTORY: This patient is referred to the sleep lab for a baseline Polysomnography. Pertinent history includes a history of diagnosis of excessive daytime somnolence and snoring.  PROCEDURE: This overnight polysomnogram was performed using the Alice 5 acquisition system using the standard diagnostic protocol as outlined by the AASM. This includes 6 channels of EEG, 2 channelscannels of EOG, chin EMG, bilateral anterior tibialis EMG, nasal/oral thermister, PTAF, chest and abdominal wall movements, ECG and pulse oximetry. Apneas and Hypopneas were scored per AASM definition.  SLEEP ARCHITECHTURE: This is a baseline polysomnograph  study. The total recording time was 402.0 minutes and the patients total sleep time is noted to be 311.5 minutes. Sleep onset latency was 15.0 minutes and is within normal limits.  Stage R sleep onset latency was 235.5 minutes. Sleep maintenance efficiency was 77.6% and is decreased.  Sleep staging expressed as a percentage of total sleep time demonstrated 2.7% N1, 43.3% N2 and 34.5% N3  sleep. Stage R represents 19.4% of total sleep time. This is decreased.  There were a total of 63 arousals  for an overall arousal index of 12.1 per hour of sleep. PLMS arousal were not noted. Arousals without respiratory events are  noted. This can contribute to sleep architechture disruption.  RESPIRATORY MONITORING:   Patient exhibits some evidence of sleep disorderd breathing characterized by 2 central apneas, 1 obstructive apneas and 0 mixed apneas. There were 18 obstructive hypopneas and 3 RERAs. Most of the apneas/hypopneas were of obstructive variety. The total apnea hypopnea index (apneas and hypopneas per hour of sleep) is 4.0 respiratory events per hour and is actually within normal  limits.  Respiratory monitoring demonstrated mild snoring through the night. There are a total of 115 snoring episodes representing 12% % of sleep.   Baseline oxygen saturation during wakefulness was 95% and during NREM sleep averaged 93% through the night. Arterial saturation during REM sleep was 95% through the night. There was significant  oxygen desaturation with the respiratory events. Arterial oxygen desaturation occurred of at least 4% was noted with a low saturation of 85%. The study was performed off oxygen.  CARDIAC MONITORING:   Average heart rate is 61 during sleep with a high of 95 beats per minute. Malignant arrhythmias were not noted.  CPAP titration: Patient was titrated on CPAP according to the CPAP protocol.  Patient was started on pressure for titrated to a pressure of 8.  Patient appeared to tolerate the CPAP fairly well.  On a pressure of 8 cm water pressure patient exhibited 60.2 minutes of sleep with 30 minutes of stage R.  There were no apneas and no hypopneas noted on this pressure.  The patient had a lowest saturation of 93% and appeared to tolerate the percentage fairly well  IMPRESSIONS:  --This overnight polysomnogram demonstrates insignificant sleep apnea with an overall AHI 4.0 per hour.  This is due to good response to CPAP --The overall AHI was no worse  during Stage R. --There were associated some arterial oxygen desaturations noted with a low saturation of 85% --There was no significant PLMS noted in this study. --There is mild snoring noted throughout the study.    RECOMMENDATIONS:  --CPAP titration study is adequate to control this patient's obstructive sleep apnea.  The optimal pressure appears to be 8 cm water pressure.  On this pressure the oxygenation also did improve significantly.. --Nasal decongestants and antihistamines may be of help for increased upper airways resistance when present. --Weight loss through dietary and lifestyle modification is  recommended in the presence of obesity. --A search for and treatment of any underlying cardiopulmonary disease is      recommended in the presence of oxygen desaturations. --Alternative treatment options if the patient is not willing to use CPAP include oral   appliances as well as surgical intervention which may help in the appropriate patient. --Clinical correlation is recommended. Please feel free to call the office for any further  questions or assistance in the care of this patient.     Yevonne Pax, MD [facility] Pulmonary Critical Care Medicine Sleep medicine

## 2018-12-18 ENCOUNTER — Other Ambulatory Visit: Payer: Self-pay

## 2018-12-18 ENCOUNTER — Encounter: Payer: Self-pay | Admitting: Internal Medicine

## 2018-12-18 ENCOUNTER — Ambulatory Visit (INDEPENDENT_AMBULATORY_CARE_PROVIDER_SITE_OTHER): Payer: BC Managed Care – PPO | Admitting: Internal Medicine

## 2018-12-18 VITALS — BP 122/88 | HR 85 | Temp 97.6°F | Resp 16 | Ht 59.0 in | Wt 140.0 lb

## 2018-12-18 DIAGNOSIS — I10 Essential (primary) hypertension: Secondary | ICD-10-CM | POA: Diagnosis not present

## 2018-12-18 DIAGNOSIS — G4733 Obstructive sleep apnea (adult) (pediatric): Secondary | ICD-10-CM | POA: Diagnosis not present

## 2018-12-18 NOTE — Progress Notes (Signed)
[facility] [address] [place], [place] [address]  Pulmonary Sleep Medicine   Office Visit Note  Patient Name: Barbara Cantrell DOB: Jun 18, 1965 MRN [id number]  Date of Service: 12/18/2018  Complaints/HPI: Pt is here for follow on titration study. The titration study shows her optimal pressure is 8 cm of H2O.  She can Will order patients machine at this time.   ROS  General: (-) fever, (-) chills, (-) night sweats, (-) weakness Skin: (-) rashes, (-) itching,. Eyes: (-) visual changes, (-) redness, (-) itching. Nose and Sinuses: (-) nasal stuffiness or itchiness, (-) postnasal drip, (-) nosebleeds, (-) sinus trouble. Mouth and Throat: (-) sore throat, (-) hoarseness. Neck: (-) swollen glands, (-) enlarged thyroid, (-) neck pain. Respiratory: - cough, (-) bloody sputum, - shortness of breath, - wheezing. Cardiovascular: - ankle swelling, (-) chest pain. Lymphatic: (-) lymph node enlargement. Neurologic: (-) numbness, (-) tingling. Psychiatric: (-) anxiety, (-) depression   Current Medication: Outpatient Encounter Medications as of 12/18/2018  Medication Sig Note  . amLODipine (NORVASC) 5 MG tablet TAKE 1 AND 1/2 TABLET BY MOUTH EVERY DAY FOR BLOOD PRESSURE   . ergocalciferol (DRISDOL) 1.25 MG (50000 UT) capsule Take 1 capsule (50,000 Units total) by mouth once a week.   . fluticasone (FLONASE) 50 MCG/ACT nasal spray Place 1 spray into both nostrils.   Marland Kitchen ibuprofen (ADVIL,MOTRIN) 600 MG tablet TAKE 1 TABLET(S) BY MOUTH THREE TIMES A DAY AS NEEDED FOR PAIN 02/11/2015: Received from: External Pharmacy  . methylPREDNISolone (MEDROL) 4 MG TBPK tablet Take by mouth as directed for 6 days   . montelukast (SINGULAIR) 10 MG tablet TAKE 1 TABLET BY MOUTH EVERY DAY FOR SINUSES   . rosuvastatin (CRESTOR) 5 MG tablet Take 1 tablet (5 mg total) by mouth daily.    No facility-administered encounter medications on file as of 12/18/2018.     Surgical History: Past Surgical History:   Procedure Laterality Date  . TUBAL LIGATION      Medical History: Past Medical History:  Diagnosis Date  . Constipation   . Hypertension   . Pelvic pain in female   . PMB (postmenopausal bleeding)   . Postmenopausal bleeding 02/11/2015    Family History: Family History  Problem Relation Age of Onset  . Ovarian cancer Sister   . Colon cancer Brother   . Asthma Maternal Aunt   . Diabetes Maternal Aunt   . Kidney disease Maternal Aunt   . Heart disease Neg Hx     Social History: Social History   Socioeconomic History  . Marital status: Married    Spouse name: Not on file  . Number of children: Not on file  . Years of education: Not on file  . Highest education level: Not on file  Occupational History  . Not on file  Social Needs  . Financial resource strain: Not on file  . Food insecurity    Worry: Not on file    Inability: Not on file  . Transportation needs    Medical: Not on file    Non-medical: Not on file  Tobacco Use  . Smoking status: Never Smoker  . Smokeless tobacco: Never Used  Substance and Sexual Activity  . Alcohol use: Yes    Comment: occas  . Drug use: No  . Sexual activity: Yes    Birth control/protection: Surgical  Lifestyle  . Physical activity    Days per week: Not on file    Minutes per session: Not on file  .  Stress: Not on file  Relationships  . Social Herbalist on phone: Not on file    Gets together: Not on file    Attends religious service: Not on file    Active member of club or organization: Not on file    Attends meetings of clubs or organizations: Not on file    Relationship status: Not on file  . Intimate partner violence    Fear of current or ex partner: Not on file    Emotionally abused: Not on file    Physically abused: Not on file    Forced sexual activity: Not on file  Other Topics Concern  . Not on file  Social History Narrative  . Not on file    Vital Signs: Blood pressure 122/88, pulse 85,  temperature 97.6 F (36.4 C), resp. rate 16, height 4\' 11"  (1.499 m), weight 140 lb (63.5 kg), last menstrual period 01/09/2015, SpO2 97 %.  Examination: General Appearance: The patient is well-developed, well-nourished, and in no distress. Skin: Gross inspection of skin unremarkable. Head: normocephalic, no gross deformities. Eyes: no gross deformities noted. ENT: ears appear grossly normal no exudates. Neck: Supple. No thyromegaly. No LAD. Respiratory: clear bilaterally. Cardiovascular: Normal S1 and S2 without murmur or rub. Extremities: No cyanosis. pulses are equal. Neurologic: Alert and oriented. No involuntary movements.  LABS: Recent Results (from the past 2160 hour(s))  Comprehensive metabolic panel     Status: Abnormal   Collection Time: 11/06/18  9:55 AM  Result Value Ref Range   Glucose 109 (H) 65 - 99 mg/dL   BUN 12 6 - 24 mg/dL   Creatinine, Ser 0.77 0.57 - 1.00 mg/dL   GFR calc non Af Amer 88 >59 mL/min/1.73   GFR calc Af Amer 101 >59 mL/min/1.73   BUN/Creatinine Ratio 16 9 - 23   Sodium 140 134 - 144 mmol/L   Potassium 4.3 3.5 - 5.2 mmol/L   Chloride 104 96 - 106 mmol/L   CO2 21 20 - 29 mmol/L   Calcium 9.9 8.7 - 10.2 mg/dL   Total Protein 7.9 6.0 - 8.5 g/dL   Albumin 4.7 3.8 - 4.9 g/dL   Globulin, Total 3.2 1.5 - 4.5 g/dL   Albumin/Globulin Ratio 1.5 1.2 - 2.2   Bilirubin Total 0.3 0.0 - 1.2 mg/dL   Alkaline Phosphatase 69 39 - 117 IU/L   AST 16 0 - 40 IU/L   ALT 16 0 - 32 IU/L  CBC     Status: None   Collection Time: 11/06/18  9:55 AM  Result Value Ref Range   WBC 7.9 3.4 - 10.8 x10E3/uL   RBC 4.69 3.77 - 5.28 x10E6/uL   Hemoglobin 13.2 11.1 - 15.9 g/dL   Hematocrit 40.5 34.0 - 46.6 %   MCV 86 79 - 97 fL   MCH 28.1 26.6 - 33.0 pg   MCHC 32.6 31.5 - 35.7 g/dL   RDW 13.8 11.7 - 15.4 %   Platelets 344 150 - 450 x10E3/uL  Lipid Panel w/o Chol/HDL Ratio     Status: Abnormal   Collection Time: 11/06/18  9:55 AM  Result Value Ref Range   Cholesterol,  Total 203 (H) 100 - 199 mg/dL   Triglycerides 167 (H) 0 - 149 mg/dL   HDL 45 >39 mg/dL   VLDL Cholesterol Cal 30 5 - 40 mg/dL   LDL Chol Calc (NIH) 128 (H) 0 - 99 mg/dL  T4, free     Status: None  Collection Time: 11/06/18  9:55 AM  Result Value Ref Range   Free T4 1.01 0.82 - 1.77 ng/dL  TSH     Status: None   Collection Time: 11/06/18  9:55 AM  Result Value Ref Range   TSH 0.998 0.450 - 4.500 uIU/mL  VITAMIN D 25 Hydroxy (Vit-D Deficiency, Fractures)     Status: None   Collection Time: 11/06/18  9:55 AM  Result Value Ref Range   Vit D, 25-Hydroxy 52.0 30.0 - 100.0 ng/mL    Comment: Vitamin D deficiency has been defined by the Institute of Medicine and an Endocrine Society practice guideline as a level of serum 25-OH vitamin D less than 20 ng/mL (1,2). The Endocrine Society went on to further define vitamin D insufficiency as a level between 21 and 29 ng/mL (2). 1. IOM (Institute of Medicine). 2010. Dietary reference    intakes for calcium and D. [place]: The    [employer]. 2. Holick MF, Binkley [place], Bischoff-Ferrari HA, et al.    Evaluation, treatment, and prevention of vitamin D    deficiency: an Endocrine Society clinical practice    guideline. JCEM. 2011 Jul; 96(7):1911-30.     Radiology: Dg Cervical Spine Complete  Result Date: 09/04/2015 CLINICAL DATA:  Right neck pain radiating to right shoulder. No known injury. Symptoms for 6 months to 1 year. EXAM: CERVICAL SPINE - COMPLETE 4+ VIEW COMPARISON:  None. FINDINGS: Normal alignment. Disc spaces are maintained. Prevertebral soft tissues are normal. No neural foraminal narrowing. Early degenerative facet disease bilaterally. IMPRESSION: No acute bony abnormality. Electronically Signed   By: Charlett NoseKevin  Dover M.D.   On: 09/04/2015 14:52  Dg Lumbar Spine Complete  Result Date: 09/04/2015 CLINICAL DATA:  Low right-sided back pain radiates to right leg for 6 months. No known injury. EXAM: LUMBAR SPINE - COMPLETE  4+ VIEW COMPARISON:  None. FINDINGS: There is no evidence of lumbar spine fracture. Alignment is normal. Intervertebral disc spaces are maintained. IMPRESSION: Negative. Electronically Signed   By: Kennith CenterEric  Mansell M.D.   On: 09/04/2015 14:52   No results found.  No results found.    Assessment and Plan: Patient Active Problem List   Diagnosis Date Noted  . Acute right-sided low back pain with right-sided sciatica 11/14/2018  . Loud snoring 11/14/2018  . Excessive daytime sleepiness 11/14/2018  . Screening for colon cancer 11/14/2018  . Essential hypertension 03/28/2017  . Mixed hyperlipidemia 03/28/2017  . Vitamin D deficiency 03/28/2017  . Routine cervical smear 03/28/2017  . Postmenopausal bleeding 02/11/2015  . Family history of breast cancer 02/11/2015  . Family history of ovarian cancer 02/11/2015  . Family history of colon cancer 02/11/2015   1. OSA (obstructive sleep apnea) Cpap ordered, pt will be advised of setup and delivery.  Will follow up after pt has been wearing cpap.  - For home use only DME continuous positive airway pressure (CPAP)  2. Essential hypertension Stable, continue present management..    General Counseling: I have discussed the findings of the evaluation and examination with Harriett SineNancy.  I have also discussed any further diagnostic evaluation thatmay be needed or ordered today. Bhakti verbalizes understanding of the findings of todays visit. We also reviewed her medications today and discussed drug interactions and side effects including but not limited excessive drowsiness and altered mental states. We also discussed that there is always a risk not just to her but also people around her. she has been encouraged to call the office with any questions or concerns  that should arise related to todays visit.    Time spent: 15 This patient was seen by Blima Ledger AGNP-C in Collaboration with Dr. Freda Munro as a part of collaborative care agreement.   I have  personally obtained a history, examined the patient, evaluated laboratory and imaging results, formulated the assessment and plan and placed orders.    Yevonne Pax, MD [facility] Pulmonary and Critical Care Sleep medicine

## 2018-12-19 ENCOUNTER — Telehealth: Payer: Self-pay

## 2018-12-19 NOTE — Telephone Encounter (Signed)
Gave [facility] new cpap orders for new set up RX. beth

## 2019-01-01 ENCOUNTER — Telehealth: Payer: Self-pay

## 2019-01-01 NOTE — Telephone Encounter (Signed)
Confirmed appointment with patient. klh °

## 2019-01-03 ENCOUNTER — Ambulatory Visit: Payer: BC Managed Care – PPO

## 2019-01-03 ENCOUNTER — Other Ambulatory Visit: Payer: Self-pay

## 2019-01-03 DIAGNOSIS — G4733 Obstructive sleep apnea (adult) (pediatric): Secondary | ICD-10-CM

## 2019-01-03 NOTE — Progress Notes (Signed)
New cpap setup  Resmed auto cpap at 8 cmh2o with a heated humidifier, climate line and a resmed N-30 nasal mask in a med. She understood wearing, cleaning and following up with cpap.

## 2019-01-08 DIAGNOSIS — Z800 Family history of malignant neoplasm of digestive organs: Secondary | ICD-10-CM | POA: Diagnosis not present

## 2019-01-08 DIAGNOSIS — Z01818 Encounter for other preprocedural examination: Secondary | ICD-10-CM | POA: Diagnosis not present

## 2019-01-08 DIAGNOSIS — Z1211 Encounter for screening for malignant neoplasm of colon: Secondary | ICD-10-CM | POA: Diagnosis not present

## 2019-01-11 ENCOUNTER — Telehealth: Payer: Self-pay

## 2019-01-11 NOTE — Telephone Encounter (Signed)
VERBAL ORDER FOR HOME MEDICAL EQUIPMENT SIGNED AND PLACED IN [facility] FOLDER. 

## 2019-01-23 ENCOUNTER — Other Ambulatory Visit: Payer: Self-pay

## 2019-01-23 DIAGNOSIS — E782 Mixed hyperlipidemia: Secondary | ICD-10-CM

## 2019-01-23 MED ORDER — ROSUVASTATIN CALCIUM 5 MG PO TABS: 5.0000 mg | ORAL_TABLET | Freq: Every day | ORAL | 3 refills | Status: DC

## 2019-01-23 MED ORDER — AMLODIPINE BESYLATE 5 MG PO TABS: ORAL_TABLET | ORAL | 3 refills | Status: DC

## 2019-02-12 ENCOUNTER — Telehealth: Payer: Self-pay

## 2019-02-12 NOTE — Telephone Encounter (Signed)
Rescheduled appointment on 02/14/2019 to 02/21/2019. klh 

## 2019-02-14 ENCOUNTER — Ambulatory Visit: Payer: BC Managed Care – PPO

## 2019-02-21 ENCOUNTER — Ambulatory Visit: Payer: BC Managed Care – PPO

## 2019-02-21 ENCOUNTER — Other Ambulatory Visit: Payer: Self-pay

## 2019-02-21 DIAGNOSIS — G4733 Obstructive sleep apnea (adult) (pediatric): Secondary | ICD-10-CM

## 2019-02-21 NOTE — Progress Notes (Signed)
95 percentile pressure 8   95th percentile leak 6.3   apnea index 0.2 /hr  apnea-hypopnea index  0.9 /hr   total days used  >4 hr 28 days  total days used <4 hr 2 days  Total compliance 93 percent  Doing great no problems or questions

## 2019-03-07 DIAGNOSIS — Z01818 Encounter for other preprocedural examination: Secondary | ICD-10-CM | POA: Diagnosis not present

## 2019-03-12 DIAGNOSIS — K5730 Diverticulosis of large intestine without perforation or abscess without bleeding: Secondary | ICD-10-CM | POA: Diagnosis not present

## 2019-03-12 DIAGNOSIS — Z1211 Encounter for screening for malignant neoplasm of colon: Secondary | ICD-10-CM | POA: Diagnosis not present

## 2019-03-12 DIAGNOSIS — Z800 Family history of malignant neoplasm of digestive organs: Secondary | ICD-10-CM | POA: Diagnosis not present

## 2019-03-15 ENCOUNTER — Telehealth: Payer: Self-pay

## 2019-03-15 NOTE — Telephone Encounter (Signed)
Rescheduled appointment on 03/19/2019 to 04/09/2019. klh

## 2019-03-19 ENCOUNTER — Ambulatory Visit: Payer: BC Managed Care – PPO | Admitting: Internal Medicine

## 2019-03-21 ENCOUNTER — Other Ambulatory Visit: Payer: Self-pay

## 2019-03-21 DIAGNOSIS — E559 Vitamin D deficiency, unspecified: Secondary | ICD-10-CM

## 2019-03-21 MED ORDER — ERGOCALCIFEROL 1.25 MG (50000 UT) PO CAPS: 50000.0000 [IU] | ORAL_CAPSULE | ORAL | 3 refills | Status: DC

## 2019-03-27 ENCOUNTER — Telehealth: Payer: Self-pay

## 2019-03-27 NOTE — Telephone Encounter (Signed)
CONFIRMED 03-29-19 OV AS VIRTUAL. 

## 2019-03-29 ENCOUNTER — Other Ambulatory Visit: Payer: Self-pay

## 2019-03-29 ENCOUNTER — Encounter: Payer: Self-pay | Admitting: Nurse Practitioner

## 2019-03-29 ENCOUNTER — Ambulatory Visit (INDEPENDENT_AMBULATORY_CARE_PROVIDER_SITE_OTHER): Payer: BC Managed Care – PPO | Admitting: Nurse Practitioner

## 2019-03-29 VITALS — Ht 59.0 in | Wt 138.0 lb

## 2019-03-29 DIAGNOSIS — Z800 Family history of malignant neoplasm of digestive organs: Secondary | ICD-10-CM

## 2019-03-29 DIAGNOSIS — E782 Mixed hyperlipidemia: Secondary | ICD-10-CM

## 2019-03-29 DIAGNOSIS — I10 Essential (primary) hypertension: Secondary | ICD-10-CM

## 2019-03-29 DIAGNOSIS — G4733 Obstructive sleep apnea (adult) (pediatric): Secondary | ICD-10-CM | POA: Diagnosis not present

## 2019-03-29 NOTE — Progress Notes (Signed)
[facility] [place], [place] [address]  Internal MEDICINE  Telephone Visit  Patient Name: Barbara Cantrell  [id number]  [id number]  Date of Service: 03/29/2019  I connected with the patient at 9:48am by webcam and verified the patients identity using two identifiers.   I discussed the limitations, risks, security and privacy concerns of performing an evaluation and management service by webcam and the availability of in person appointments. I also discussed with the patient that there may be a patient responsible charge related to the service.  The patient expressed understanding and agrees to proceed.    Chief Complaint  Patient presents with  . Telephone Screen  . Telephone Assessment    The patient has been contacted via webcam for follow up visit due to concerns for spread of novel coronavirus.  She had colonoscopy done 03/12/2019. Was told that she had some diverticulosis and it was otherwise normal. She is to repeat the study in 5 years. She states that she is doing well. She is using her CPAP every night. States that she sleeps much better with this. She has no new concerns or complaints.       Current Medication: Outpatient Encounter Medications as of 03/29/2019  Medication Sig Note  . amLODipine (NORVASC) 5 MG tablet TAKE 1 AND 1/2 TABLET BY MOUTH EVERY DAY FOR BLOOD PRESSURE   . ergocalciferol (DRISDOL) 1.25 MG (50000 UT) capsule Take 1 capsule (50,000 Units total) by mouth once a week.   . fluticasone (FLONASE) 50 MCG/ACT nasal spray Place 1 spray into both nostrils.   Marland Kitchen ibuprofen (ADVIL,MOTRIN) 600 MG tablet TAKE 1 TABLET(S) BY MOUTH THREE TIMES A DAY AS NEEDED FOR PAIN 02/11/2015: Received from: External Pharmacy  . methylPREDNISolone (MEDROL) 4 MG TBPK tablet Take by mouth as directed for 6 days   . montelukast (SINGULAIR) 10 MG tablet TAKE 1 TABLET BY MOUTH EVERY DAY FOR SINUSES   . rosuvastatin (CRESTOR) 5 MG tablet Take 1 tablet (5 mg total) by  mouth daily.    No facility-administered encounter medications on file as of 03/29/2019.    Surgical History: Past Surgical History:  Procedure Laterality Date  . TUBAL LIGATION      Medical History: Past Medical History:  Diagnosis Date  . Constipation   . Hypertension   . Pelvic pain in female   . PMB (postmenopausal bleeding)   . Postmenopausal bleeding 02/11/2015    Family History: Family History  Problem Relation Age of Onset  . Ovarian cancer Sister   . Colon cancer Brother   . Asthma Maternal Aunt   . Diabetes Maternal Aunt   . Kidney disease Maternal Aunt   . Heart disease Neg Hx     Social History   Socioeconomic History  . Marital status: Married    Spouse name: Not on file  . Number of children: Not on file  . Years of education: Not on file  . Highest education level: Not on file  Occupational History  . Not on file  Tobacco Use  . Smoking status: Never Smoker  . Smokeless tobacco: Never Used  Substance and Sexual Activity  . Alcohol use: Yes    Comment: occas  . Drug use: No  . Sexual activity: Yes    Birth control/protection: Surgical  Other Topics Concern  . Not on file  Social History Narrative  . Not on file   Social Determinants of Health   Financial Resource Strain:   . Difficulty of  Paying Living Expenses: Not on file  Food Insecurity:   . Worried About Programme researcher, broadcasting/film/video in the Last Year: Not on file  . Ran Out of Food in the Last Year: Not on file  Transportation Needs:   . Lack of Transportation (Medical): Not on file  . Lack of Transportation (Non-Medical): Not on file  Physical Activity:   . Days of Exercise per Week: Not on file  . Minutes of Exercise per Session: Not on file  Stress:   . Feeling of Stress : Not on file  Social Connections:   . Frequency of Communication with Friends and Family: Not on file  . Frequency of Social Gatherings with Friends and Family: Not on file  . Attends Religious Services: Not on file   . Active Member of Clubs or Organizations: Not on file  . Attends Banker Meetings: Not on file  . Marital Status: Not on file  Intimate Partner Violence:   . Fear of Current or Ex-Partner: Not on file  . Emotionally Abused: Not on file  . Physically Abused: Not on file  . Sexually Abused: Not on file      Review of Systems  Constitutional: Negative for activity change, chills, fatigue and unexpected weight change.  HENT: Negative for congestion, postnasal drip, rhinorrhea, sneezing and sore throat.   Respiratory: Positive for apnea. Negative for cough, chest tightness and shortness of breath.        Doing well with CPAP treatment  Cardiovascular: Negative for chest pain and palpitations.  Gastrointestinal: Negative for abdominal pain, constipation, diarrhea, nausea and vomiting.  Endocrine: Negative for cold intolerance, heat intolerance, polydipsia and polyuria.  Musculoskeletal: Negative for arthralgias, back pain, joint swelling and neck pain.  Skin: Negative for rash.  Allergic/Immunologic: Negative for environmental allergies.  Neurological: Negative for tremors, weakness, numbness and headaches.  Hematological: Negative for adenopathy. Does not bruise/bleed easily.  Psychiatric/Behavioral: Negative for behavioral problems (Depression), sleep disturbance and suicidal ideas. The patient is not nervous/anxious.     Today's Vitals   03/29/19 0944  Weight: 138 lb (62.6 kg)  Height: 4\' 11"  (1.499 m)   Body mass index is 27.87 kg/m.  Observation/Objective:   The patient is alert and oriented. She is pleasant and answers all questions appropriately. Breathing is non-labored. She is in no acute distress at this time.    Assessment/Plan: 1. Essential hypertension Stable. Continue bp medication as prescribed   2. Mixed hyperlipidemia Continue crestor as prescribed   3. OSA (obstructive sleep apnea) Doing well on CPAP. Routine visits with Dr.  for management.   4. Family history of colon cancer Colonoscopy done 03/12/2019. Mild diverticulosis. Repeat study in 5 years.   General Counseling: 05/10/2019 understanding of the findings of today's phone visit and agrees with plan of treatment. I have discussed any further diagnostic evaluation that may be needed or ordered today. We also reviewed her medications today. she has been encouraged to call the office with any questions or concerns that should arise related to todays visit.  This patient was seen by Lily Peer FNP Collaboration with Dr Vincent Gros as a part of collaborative care agreement  Time spent: 20 Minutes    Dr Lyndon Code Internal medicine

## 2019-04-04 ENCOUNTER — Telehealth: Payer: Self-pay

## 2019-04-04 NOTE — Telephone Encounter (Signed)
Confirmed appointment on 04/09/2019 and screened for covid. klh 

## 2019-04-05 DIAGNOSIS — G4733 Obstructive sleep apnea (adult) (pediatric): Secondary | ICD-10-CM | POA: Diagnosis not present

## 2019-04-09 ENCOUNTER — Encounter: Payer: Self-pay | Admitting: Internal Medicine

## 2019-04-09 ENCOUNTER — Other Ambulatory Visit: Payer: Self-pay

## 2019-04-09 ENCOUNTER — Ambulatory Visit: Payer: BC Managed Care – PPO | Admitting: Internal Medicine

## 2019-04-09 VITALS — BP 139/79 | HR 74 | Temp 97.7°F | Resp 16 | Ht 59.0 in | Wt 145.2 lb

## 2019-04-09 DIAGNOSIS — I10 Essential (primary) hypertension: Secondary | ICD-10-CM | POA: Diagnosis not present

## 2019-04-09 DIAGNOSIS — Z9989 Dependence on other enabling machines and devices: Secondary | ICD-10-CM

## 2019-04-09 DIAGNOSIS — J302 Other seasonal allergic rhinitis: Secondary | ICD-10-CM | POA: Diagnosis not present

## 2019-04-09 DIAGNOSIS — G4733 Obstructive sleep apnea (adult) (pediatric): Secondary | ICD-10-CM

## 2019-04-09 NOTE — Progress Notes (Signed)
[facility] [place], [place] [address]  Pulmonary Sleep Medicine   Office Visit Note  Patient Name: Barbara Cantrell DOB: Jul 20, 1965 MRN [id number]  Date of Service: 04/09/2019  Complaints/HPI: Patient is here today for routine pulmonary follow-up. Recently placed on CPAP for OSA. Most recent downloads show 93% compliance and her AHI 0.9. Reports no issues with machine or mask. Cleaning machine by hand and changing tubing and filters as directed. Denies shortness of breath, chest pain or cough.  ROS  General: (-) fever, (-) chills, (-) night sweats, (-) weakness Skin: (-) rashes, (-) itching,. Eyes: (-) visual changes, (-) redness, (-) itching. Nose and Sinuses: (-) nasal stuffiness or itchiness, (-) postnasal drip, (-) nosebleeds, (-) sinus trouble. Mouth and Throat: (-) sore throat, (-) hoarseness. Neck: (-) swollen glands, (-) enlarged thyroid, (-) neck pain. Respiratory: - cough, (-) bloody sputum, - shortness of breath, - wheezing. Cardiovascular: - ankle swelling, (-) chest pain. Lymphatic: (-) lymph node enlargement. Neurologic: (-) numbness, (-) tingling. Psychiatric: (-) anxiety, (-) depression   Current Medication: Outpatient Encounter Medications as of 04/09/2019  Medication Sig Note  . amLODipine (NORVASC) 5 MG tablet TAKE 1 AND 1/2 TABLET BY MOUTH EVERY DAY FOR BLOOD PRESSURE   . ergocalciferol (DRISDOL) 1.25 MG (50000 UT) capsule Take 1 capsule (50,000 Units total) by mouth once a week.   . fluticasone (FLONASE) 50 MCG/ACT nasal spray Place 1 spray into both nostrils.   Marland Kitchen ibuprofen (ADVIL,MOTRIN) 600 MG tablet TAKE 1 TABLET(S) BY MOUTH THREE TIMES A DAY AS NEEDED FOR PAIN 02/11/2015: Received from: External Pharmacy  . methylPREDNISolone (MEDROL) 4 MG TBPK tablet Take by mouth as directed for 6 days   . montelukast (SINGULAIR) 10 MG tablet TAKE 1 TABLET BY MOUTH EVERY DAY FOR SINUSES   . rosuvastatin (CRESTOR) 5 MG tablet Take 1 tablet (5 mg  total) by mouth daily.    No facility-administered encounter medications on file as of 04/09/2019.    Surgical History: Past Surgical History:  Procedure Laterality Date  . TUBAL LIGATION      Medical History: Past Medical History:  Diagnosis Date  . Constipation   . Hypertension   . Pelvic pain in female   . PMB (postmenopausal bleeding)   . Postmenopausal bleeding 02/11/2015    Family History: Family History  Problem Relation Age of Onset  . Ovarian cancer Sister   . Colon cancer Brother   . Asthma Maternal Aunt   . Diabetes Maternal Aunt   . Kidney disease Maternal Aunt   . Heart disease Neg Hx     Social History: Social History   Socioeconomic History  . Marital status: Married    Spouse name: Not on file  . Number of children: Not on file  . Years of education: Not on file  . Highest education level: Not on file  Occupational History  . Not on file  Tobacco Use  . Smoking status: Never Smoker  . Smokeless tobacco: Never Used  Substance and Sexual Activity  . Alcohol use: Yes    Comment: occas  . Drug use: No  . Sexual activity: Yes    Birth control/protection: Surgical  Other Topics Concern  . Not on file  Social History Narrative  . Not on file   Social Determinants of Health   Financial Resource Strain:   . Difficulty of Paying Living Expenses: Not on file  Food Insecurity:   . Worried About Charity fundraiser in the  Last Year: Not on file  . Ran Out of Food in the Last Year: Not on file  Transportation Needs:   . Lack of Transportation (Medical): Not on file  . Lack of Transportation (Non-Medical): Not on file  Physical Activity:   . Days of Exercise per Week: Not on file  . Minutes of Exercise per Session: Not on file  Stress:   . Feeling of Stress : Not on file  Social Connections:   . Frequency of Communication with Friends and Family: Not on file  . Frequency of Social Gatherings with Friends and Family: Not on file  . Attends  Religious Services: Not on file  . Active Member of Clubs or Organizations: Not on file  . Attends Banker Meetings: Not on file  . Marital Status: Not on file  Intimate Partner Violence:   . Fear of Current or Ex-Partner: Not on file  . Emotionally Abused: Not on file  . Physically Abused: Not on file  . Sexually Abused: Not on file    Vital Signs: Blood pressure 139/79, pulse 74, temperature 97.7 F (36.5 C), resp. rate 16, height 4\' 11"  (1.499 m), weight 145 lb 3.2 oz (65.9 kg), last menstrual period 01/09/2015, SpO2 97 %.  Examination: General Appearance: The patient is well-developed, well-nourished, and in no distress. Skin: Gross inspection of skin unremarkable. Head: normocephalic, no gross deformities. Eyes: no gross deformities noted. ENT: ears appear grossly normal no exudates. Neck: Supple. No thyromegaly. No LAD. Respiratory: Clean lung sounds bilaterally. Cardiovascular: Normal S1 and S2 without murmur or rub. Extremities: No cyanosis. pulses are equal. Neurologic: Alert and oriented. No involuntary movements.  LABS: No results found for this or any previous visit (from the past 2160 hour(s)).  Radiology: DG Cervical Spine Complete  Result Date: 09/04/2015 CLINICAL DATA:  Right neck pain radiating to right shoulder. No known injury. Symptoms for 6 months to 1 year. EXAM: CERVICAL SPINE - COMPLETE 4+ VIEW COMPARISON:  None. FINDINGS: Normal alignment. Disc spaces are maintained. Prevertebral soft tissues are normal. No neural foraminal narrowing. Early degenerative facet disease bilaterally. IMPRESSION: No acute bony abnormality. Electronically Signed   By: 09/06/2015 M.D.   On: 09/04/2015 14:52  DG Lumbar Spine Complete  Result Date: 09/04/2015 CLINICAL DATA:  Low right-sided back pain radiates to right leg for 6 months. No known injury. EXAM: LUMBAR SPINE - COMPLETE 4+ VIEW COMPARISON:  None. FINDINGS: There is no evidence of lumbar spine fracture.  Alignment is normal. Intervertebral disc spaces are maintained. IMPRESSION: Negative. Electronically Signed   By: 09/06/2015 M.D.   On: 09/04/2015 14:52   No results found.  No results found.    Assessment and Plan: Patient Active Problem List   Diagnosis Date Noted  . OSA (obstructive sleep apnea) 03/29/2019  . Acute right-sided low back pain with right-sided sciatica 11/14/2018  . Loud snoring 11/14/2018  . Excessive daytime sleepiness 11/14/2018  . Screening for colon cancer 11/14/2018  . Essential hypertension 03/28/2017  . Mixed hyperlipidemia 03/28/2017  . Vitamin D deficiency 03/28/2017  . Routine cervical smear 03/28/2017  . Postmenopausal bleeding 02/11/2015  . Family history of breast cancer 02/11/2015  . Family history of ovarian cancer 02/11/2015  . Family history of colon cancer 02/11/2015    1. OSA on CPAP Reports feeling well with wearing CPAP. Recent download shows good compliance. Continue with current therapy and will continue to monitor.  2. Essential hypertension Stable at this time, continue with current therapy  and continue to monitor.  3. Seasonal allergies Stable at this time, continue with Flonase daily and continue to monitor.  General Counseling: I have discussed the findings of the evaluation and examination with Harriett Sine.  I have also discussed any further diagnostic evaluation thatmay be needed or ordered today. Dima verbalizes understanding of the findings of todays visit. We also reviewed her medications today and discussed drug interactions and side effects including but not limited excessive drowsiness and altered mental states. We also discussed that there is always a risk not just to her but also people around her. she has been encouraged to call the office with any questions or concerns that should arise related to todays visit.  No orders of the defined types were placed in this encounter.    Time spent: 25 This patient was seen by Blima Ledger AGNP-C in Collaboration with Dr. Freda Munro as a part of collaborative care agreement.  I have personally obtained a history, examined the patient, evaluated laboratory and imaging results, formulated the assessment and plan and placed orders.    Yevonne Pax, MD [facility] Pulmonary and Critical Care Sleep medicine

## 2019-04-27 ENCOUNTER — Ambulatory Visit: Payer: Self-pay | Attending: Internal Medicine

## 2019-04-27 DIAGNOSIS — Z23 Encounter for immunization: Secondary | ICD-10-CM

## 2019-04-27 NOTE — Progress Notes (Signed)
   Covid-19 Vaccination Clinic  Name:  Barbara Cantrell    MRN: [id number] DOB: 19-Sep-1965  04/27/2019  Barbara Cantrell was observed post Covid-19 immunization for 15 minutes without incident. She was provided with Vaccine Information Sheet and instruction to access the V-Safe system.   Barbara Cantrell was instructed to call 911 with any severe reactions post vaccine: Marland Kitchen Difficulty breathing  . Swelling of face and throat  . A fast heartbeat  . A bad rash all over body  . Dizziness and weakness   Immunizations Administered    Name Date Dose VIS Date Route   Pfizer COVID-19 Vaccine 04/27/2019 11:09 AM 0.3 mL 01/19/2019 Intramuscular   Manufacturer: [employer], [employer]   Lot: [id number]   NDC: [address]-9

## 2019-05-03 DIAGNOSIS — G4733 Obstructive sleep apnea (adult) (pediatric): Secondary | ICD-10-CM | POA: Diagnosis not present

## 2019-05-17 ENCOUNTER — Other Ambulatory Visit: Payer: Self-pay

## 2019-05-17 DIAGNOSIS — E782 Mixed hyperlipidemia: Secondary | ICD-10-CM

## 2019-05-17 MED ORDER — ROSUVASTATIN CALCIUM 5 MG PO TABS: 5.0000 mg | ORAL_TABLET | Freq: Every day | ORAL | 3 refills | Status: DC

## 2019-05-21 ENCOUNTER — Other Ambulatory Visit: Payer: Self-pay

## 2019-05-21 MED ORDER — AMLODIPINE BESYLATE 5 MG PO TABS: ORAL_TABLET | ORAL | 3 refills | Status: DC

## 2019-05-23 ENCOUNTER — Ambulatory Visit: Payer: Self-pay | Attending: Internal Medicine

## 2019-05-23 DIAGNOSIS — Z23 Encounter for immunization: Secondary | ICD-10-CM

## 2019-05-23 NOTE — Progress Notes (Signed)
   Covid-19 Vaccination Clinic  Name:  Barbara Cantrell    MRN: [id number] DOB: 12-03-1965  05/23/2019  [name] was observed post Covid-19 immunization for 15 minutes without incident. She was provided with Vaccine Information Sheet and instruction to access the V-Safe system.   [name] was instructed to call 911 with any severe reactions post vaccine: Marland Kitchen Difficulty breathing  . Swelling of face and throat  . A fast heartbeat  . A bad rash all over body  . Dizziness and weakness   Immunizations Administered    Name Date Dose VIS Date Route   Pfizer COVID-19 Vaccine 05/23/2019 10:51 AM 0.3 mL 01/19/2019 Intramuscular   Manufacturer: [employer], [employer]   Lot: [id number]   NDC: [address]-9

## 2019-06-03 DIAGNOSIS — G4733 Obstructive sleep apnea (adult) (pediatric): Secondary | ICD-10-CM | POA: Diagnosis not present

## 2019-07-03 DIAGNOSIS — G4733 Obstructive sleep apnea (adult) (pediatric): Secondary | ICD-10-CM | POA: Diagnosis not present

## 2019-07-11 ENCOUNTER — Other Ambulatory Visit: Payer: Self-pay

## 2019-07-11 DIAGNOSIS — E559 Vitamin D deficiency, unspecified: Secondary | ICD-10-CM

## 2019-07-11 MED ORDER — ERGOCALCIFEROL 1.25 MG (50000 UT) PO CAPS: 50000.0000 [IU] | ORAL_CAPSULE | ORAL | 3 refills | Status: DC

## 2019-07-19 DIAGNOSIS — J309 Allergic rhinitis, unspecified: Secondary | ICD-10-CM | POA: Diagnosis not present

## 2019-07-25 DIAGNOSIS — I10 Essential (primary) hypertension: Secondary | ICD-10-CM | POA: Diagnosis not present

## 2019-07-25 DIAGNOSIS — Z03818 Encounter for observation for suspected exposure to other biological agents ruled out: Secondary | ICD-10-CM | POA: Diagnosis not present

## 2019-07-25 DIAGNOSIS — Z20822 Contact with and (suspected) exposure to covid-19: Secondary | ICD-10-CM | POA: Diagnosis not present

## 2019-07-30 ENCOUNTER — Encounter: Payer: BC Managed Care – PPO | Admitting: Nurse Practitioner

## 2019-08-03 DIAGNOSIS — G4733 Obstructive sleep apnea (adult) (pediatric): Secondary | ICD-10-CM | POA: Diagnosis not present

## 2019-08-22 ENCOUNTER — Ambulatory Visit: Payer: BC Managed Care – PPO

## 2019-09-19 ENCOUNTER — Other Ambulatory Visit: Payer: Self-pay

## 2019-09-19 ENCOUNTER — Ambulatory Visit: Payer: BC Managed Care – PPO

## 2019-09-19 DIAGNOSIS — G4733 Obstructive sleep apnea (adult) (pediatric): Secondary | ICD-10-CM | POA: Diagnosis not present

## 2019-09-19 NOTE — Progress Notes (Signed)
95 percentile pressure 8   95th percentile leak 2.6   apnea index 0.1 /hr  apnea-hypopnea index  0.3 /hr   total days used  >4 hr 20 days  total days used <4 hr 5 days  Total compliance 67 percent  She went out of town and forgot cpap will work on using more I will bring her new supplies next week

## 2019-10-02 ENCOUNTER — Telehealth: Payer: Self-pay

## 2019-10-02 NOTE — Telephone Encounter (Signed)
Confirmed and screened for 10-04-19 ov. 

## 2019-10-04 ENCOUNTER — Encounter: Payer: Self-pay | Admitting: Nurse Practitioner

## 2019-10-04 ENCOUNTER — Ambulatory Visit (INDEPENDENT_AMBULATORY_CARE_PROVIDER_SITE_OTHER): Payer: BC Managed Care – PPO | Admitting: Nurse Practitioner

## 2019-10-04 ENCOUNTER — Other Ambulatory Visit: Payer: Self-pay

## 2019-10-04 VITALS — BP 166/87 | HR 85 | Temp 98.1°F | Resp 16 | Ht 59.0 in | Wt 140.4 lb

## 2019-10-04 DIAGNOSIS — E782 Mixed hyperlipidemia: Secondary | ICD-10-CM | POA: Diagnosis not present

## 2019-10-04 DIAGNOSIS — R300 Dysuria: Secondary | ICD-10-CM

## 2019-10-04 DIAGNOSIS — G4733 Obstructive sleep apnea (adult) (pediatric): Secondary | ICD-10-CM

## 2019-10-04 DIAGNOSIS — I10 Essential (primary) hypertension: Secondary | ICD-10-CM

## 2019-10-04 DIAGNOSIS — Z0001 Encounter for general adult medical examination with abnormal findings: Secondary | ICD-10-CM

## 2019-10-04 MED ORDER — AMLODIPINE BESYLATE 5 MG PO TABS: ORAL_TABLET | ORAL | 3 refills | Status: DC

## 2019-10-04 NOTE — Progress Notes (Signed)
[facility] [address] [place], [place] [address]  Internal MEDICINE  Office Visit Note  Patient Name: Barbara Cantrell  [id number]  [id number]  Date of Service: 10/17/2019   Pt is here for routine health maintenance examination   Chief Complaint  Patient presents with  . Annual Exam  . Hypertension  . Quality Metric Gaps    hep C, [place]     The patient is here for health maintenance exam. Today, she is complaining of elevated blood pressure. Has been elevated for a few weeks. States that she does have a mild headache with the elevated blood pressure. She denies chest pain, chest pressure, or shortness of breath. She is due to have routine, fasting labs.    Current Medication: Outpatient Encounter Medications as of 10/04/2019  Medication Sig Note  . amLODipine (NORVASC) 5 MG tablet Take 2 tablets po QD for blood pressure   . ergocalciferol (DRISDOL) 1.25 MG (50000 UT) capsule Take 1 capsule (50,000 Units total) by mouth once a week.   . fluticasone (FLONASE) 50 MCG/ACT nasal spray Place 1 spray into both nostrils.   Marland Kitchen ibuprofen (ADVIL,MOTRIN) 600 MG tablet TAKE 1 TABLET(S) BY MOUTH THREE TIMES A DAY AS NEEDED FOR PAIN 02/11/2015: Received from: External Pharmacy  . methylPREDNISolone (MEDROL) 4 MG TBPK tablet Take by mouth as directed for 6 days   . montelukast (SINGULAIR) 10 MG tablet TAKE 1 TABLET BY MOUTH EVERY DAY FOR SINUSES   . rosuvastatin (CRESTOR) 5 MG tablet Take 1 tablet (5 mg total) by mouth daily.   . [DISCONTINUED] amLODipine (NORVASC) 5 MG tablet TAKE 1 AND 1/2 TABLET BY MOUTH EVERY DAY FOR BLOOD PRESSURE    No facility-administered encounter medications on file as of 10/04/2019.    Surgical History: Past Surgical History:  Procedure Laterality Date  . TUBAL LIGATION      Medical History: Past Medical History:  Diagnosis Date  . Constipation   . Hypertension   . Pelvic pain in female   . PMB (postmenopausal bleeding)   . Postmenopausal  bleeding 02/11/2015    Family History: Family History  Problem Relation Age of Onset  . Ovarian cancer Sister   . Colon cancer Brother   . Asthma Maternal Aunt   . Diabetes Maternal Aunt   . Kidney disease Maternal Aunt   . Heart disease Neg Hx       Review of Systems  Constitutional: Negative for activity change, chills, fatigue and unexpected weight change.  HENT: Negative for congestion, postnasal drip, rhinorrhea, sneezing and sore throat.   Respiratory: Positive for apnea. Negative for cough, chest tightness and shortness of breath.        Doing well with CPAP treatment  Cardiovascular: Negative for chest pain, palpitations and leg swelling.       Elevated blood pressure these past few weeks.   Gastrointestinal: Negative for abdominal pain, constipation, diarrhea, nausea and vomiting.  Endocrine: Negative for cold intolerance, heat intolerance, polydipsia and polyuria.  Genitourinary: Negative for dysuria, flank pain, frequency, hematuria and urgency.  Musculoskeletal: Negative for arthralgias, back pain, joint swelling and neck pain.  Skin: Negative for rash.  Allergic/Immunologic: Positive for environmental allergies.  Neurological: Positive for headaches. Negative for tremors, weakness and numbness.  Hematological: Negative for adenopathy. Does not bruise/bleed easily.  Psychiatric/Behavioral: Negative for behavioral problems (Depression), sleep disturbance and suicidal ideas. The patient is not nervous/anxious.      Today's Vitals   10/04/19 1029  BP: (!) 166/87  Pulse: 85  Resp: 16  Temp: 98.1 F (36.7 C)  SpO2: 99%  Weight: 140 lb 6.4 oz (63.7 kg)  Height: 4\' 11"  (1.499 m)   Body mass index is 28.36 kg/m.  Physical Exam Vitals and nursing note reviewed.  Constitutional:      General: She is not in acute distress.    Appearance: Normal appearance. She is well-developed. She is not diaphoretic.  HENT:     Head: Normocephalic and atraumatic.     Nose:  Nose normal.     Mouth/Throat:     Pharynx: No oropharyngeal exudate.  Eyes:     Pupils: Pupils are equal, round, and reactive to light.  Neck:     Thyroid: No thyromegaly.     Vascular: No carotid bruit or JVD.     Trachea: No tracheal deviation.  Cardiovascular:     Rate and Rhythm: Normal rate and regular rhythm.     Pulses: Normal pulses.     Heart sounds: Normal heart sounds. No murmur heard.  No friction rub. No gallop.   Pulmonary:     Effort: Pulmonary effort is normal. No respiratory distress.     Breath sounds: Normal breath sounds. No wheezing or rales.  Chest:     Chest wall: No tenderness.     Breasts:        Right: Normal. No swelling, bleeding, inverted nipple, mass, nipple discharge, skin change or tenderness.        Left: Normal. No swelling, bleeding, inverted nipple, mass, nipple discharge, skin change or tenderness.  Abdominal:     General: Bowel sounds are normal.     Palpations: Abdomen is soft.     Tenderness: There is no abdominal tenderness.  Musculoskeletal:        General: Normal range of motion.     Cervical back: Normal range of motion and neck supple.  Lymphadenopathy:     Cervical: No cervical adenopathy.     Upper Body:     Right upper body: No axillary adenopathy.     Left upper body: No axillary adenopathy.  Skin:    General: Skin is warm and dry.  Neurological:     General: No focal deficit present.     Mental Status: She is alert and oriented to person, place, and time.     Cranial Nerves: No cranial nerve deficit.  Psychiatric:        Mood and Affect: Mood normal.        Behavior: Behavior normal.        Thought Content: Thought content normal.        Judgment: Judgment normal.      LABS: Recent Results (from the past 2160 hour(s))  UA/M w/rflx Culture, Routine     Status: Abnormal   Collection Time: 10/04/19 11:07 AM   Specimen: Urine   Urine  Result Value Ref Range   Specific Gravity, UA 1.019 1.005 - 1.030   pH, UA 5.0  5.0 - 7.5   Color, UA Yellow Yellow   Appearance Ur Turbid (A) Clear   Leukocytes,UA Trace (A) Negative   Protein,UA Negative Negative/Trace   Glucose, UA Negative Negative   Ketones, UA Negative Negative   RBC, UA Trace (A) Negative   Bilirubin, UA Negative Negative   Urobilinogen, Ur 0.2 0.2 - 1.0 mg/dL   Nitrite, UA Negative Negative   Microscopic Examination See below:     Comment: Microscopic was indicated and was performed.   Urinalysis Reflex  Comment     Comment: This specimen has reflexed to a Urine Culture.  Microscopic Examination     Status: None   Collection Time: 10/04/19 11:07 AM   Urine  Result Value Ref Range   WBC, UA 0-5 0 - 5 /hpf   RBC None seen 0 - 2 /hpf   Epithelial Cells (non renal) 0-10 0 - 10 /hpf   Casts None seen None seen /lpf   Bacteria, UA None seen None seen/Few  Urine Culture, Reflex     Status: Abnormal   Collection Time: 10/04/19 11:07 AM   Urine  Result Value Ref Range   Urine Culture, Routine Final report (A)    Organism ID, Bacteria Escherichia coli (A)     Comment: Cefazolin <=4 ug/mL Cefazolin with an MIC <=16 predicts susceptibility to the oral agents cefaclor, cefdinir, cefpodoxime, cefprozil, cefuroxime, cephalexin, and loracarbef when used for therapy of uncomplicated urinary tract infections due to E. coli, Klebsiella pneumoniae, and Proteus mirabilis. 50,000-100,000 colony forming units per mL    Antimicrobial Susceptibility Comment     Comment:       ** S = Susceptible; I = Intermediate; R = Resistant **                    P = Positive; N = Negative             MICS are expressed in micrograms per mL    Antibiotic                 RSLT#1    RSLT#2    RSLT#3    RSLT#4 Amoxicillin/Clavulanic Acid    S Ampicillin                     S Cefepime                       S Ceftriaxone                    S Cefuroxime                     S Ciprofloxacin                  S Ertapenem                      S Gentamicin                      S Imipenem                       S Levofloxacin                   S Meropenem                      S Nitrofurantoin                 S Piperacillin/Tazobactam        S Tetracycline                   S Tobramycin                     S Trimethoprim/Sulfa             S     Assessment/Plan: 1. Encounter for general  adult medical examination with abnormal findings Annual health maintenance exam today. Order slip given to check routine, fasting labs.   2. Essential hypertension Increase amlodipine to 10mg  daily. Advised her to limit salt and increase water in the diet.  - amLODipine (NORVASC) 5 MG tablet; Take 2 tablets po QD for blood pressure  Dispense: 60 tablet; Refill: 3  3. Mixed hyperlipidemia Check fasting lipid panel and adjust statin medication as prescribed   4. OSA (obstructive sleep apnea) Doing well with CPAP. Continue regular visits with Dr. for CPAP management.   5. Dysuria - UA/M w/rflx Culture, Routine  General Counseling: Freda Munro understanding of the findings of todays visit and agrees with plan of treatment. I have discussed any further diagnostic evaluation that may be needed or ordered today. We also reviewed her medications today. she has been encouraged to call the office with any questions or concerns that should arise related to todays visit.    Counseling:  This patient was seen by Lily Peer FNP Collaboration with Dr Vincent Gros as a part of collaborative care agreement  Orders Placed This Encounter  Procedures  . Microscopic Examination  . Urine Culture, Reflex  . UA/M w/rflx Culture, Routine    Meds ordered this encounter  Medications  . amLODipine (NORVASC) 5 MG tablet    Sig: Take 2 tablets po QD for blood pressure    Dispense:  60 tablet    Refill:  3    Please note increased dose    Order Specific Question:   Supervising Provider    Answer:   Lyndon Code [[id number]]    Total time spent: 45 Minutes  Time spent  includes review of chart, medications, test results, and follow up plan with the patient.     Lyndon Code, MD  Internal Medicine

## 2019-10-08 LAB — MICROSCOPIC EXAMINATION
Bacteria, UA: NONE SEEN
Casts: NONE SEEN /lpf
RBC, Urine: NONE SEEN /hpf (ref 0–2)

## 2019-10-08 LAB — UA/M W/RFLX CULTURE, ROUTINE
Bilirubin, UA: NEGATIVE
Glucose, UA: NEGATIVE
Ketones, UA: NEGATIVE
Nitrite, UA: NEGATIVE
Protein,UA: NEGATIVE
Specific Gravity, UA: 1.019 (ref 1.005–1.030)
Urobilinogen, Ur: 0.2 mg/dL (ref 0.2–1.0)
pH, UA: 5 (ref 5.0–7.5)

## 2019-10-08 LAB — URINE CULTURE, REFLEX

## 2019-10-10 LAB — HM COLONOSCOPY

## 2019-10-17 DIAGNOSIS — R300 Dysuria: Secondary | ICD-10-CM | POA: Insufficient documentation

## 2019-10-17 DIAGNOSIS — Z0001 Encounter for general adult medical examination with abnormal findings: Secondary | ICD-10-CM | POA: Insufficient documentation

## 2019-10-18 ENCOUNTER — Ambulatory Visit: Payer: BC Managed Care – PPO | Admitting: Internal Medicine

## 2019-10-18 ENCOUNTER — Encounter: Payer: Self-pay | Admitting: Internal Medicine

## 2019-10-18 ENCOUNTER — Other Ambulatory Visit: Payer: Self-pay

## 2019-10-18 DIAGNOSIS — Z9989 Dependence on other enabling machines and devices: Secondary | ICD-10-CM

## 2019-10-18 DIAGNOSIS — I10 Essential (primary) hypertension: Secondary | ICD-10-CM

## 2019-10-18 DIAGNOSIS — G4733 Obstructive sleep apnea (adult) (pediatric): Secondary | ICD-10-CM | POA: Diagnosis not present

## 2019-10-18 NOTE — Progress Notes (Signed)
[facility] [address] [place], [place] [address]  Pulmonary Sleep Medicine   Office Visit Note  Patient Name: Barbara Cantrell DOB: August 13, 1965 MRN [id number]  Date of Service: 10/18/2019  Complaints/HPI:  Patient is here for routine pulmonary follow-up OSA on CPAP, reports nightly compliance with her CPAP, cleaning machine by hand and changing mask and tubing as directed Denies waking up with headaches, dryness in mouth or eyes or bloating. Feels rested during the day.  ROS  General: (-) fever, (-) chills, (-) night sweats, (-) weakness Skin: (-) rashes, (-) itching,. Eyes: (-) visual changes, (-) redness, (-) itching. Nose and Sinuses: (-) nasal stuffiness or itchiness, (-) postnasal drip, (-) nosebleeds, (-) sinus trouble. Mouth and Throat: (-) sore throat, (-) hoarseness. Neck: (-) swollen glands, (-) enlarged thyroid, (-) neck pain. Respiratory: - cough, (-) bloody sputum, - shortness of breath, - wheezing. Cardiovascular: - ankle swelling, (-) chest pain. Lymphatic: (-) lymph node enlargement. Neurologic: (-) numbness, (-) tingling. Psychiatric: (-) anxiety, (-) depression   Current Medication: Outpatient Encounter Medications as of 10/18/2019  Medication Sig Note   amLODipine (NORVASC) 5 MG tablet Take 2 tablets po QD for blood pressure    ergocalciferol (DRISDOL) 1.25 MG (50000 UT) capsule Take 1 capsule (50,000 Units total) by mouth once a week.    fluticasone (FLONASE) 50 MCG/ACT nasal spray Place 1 spray into both nostrils.    ibuprofen (ADVIL,MOTRIN) 600 MG tablet TAKE 1 TABLET(S) BY MOUTH THREE TIMES A DAY AS NEEDED FOR PAIN 02/11/2015: Received from: External Pharmacy   montelukast (SINGULAIR) 10 MG tablet TAKE 1 TABLET BY MOUTH EVERY DAY FOR SINUSES    rosuvastatin (CRESTOR) 5 MG tablet Take 1 tablet (5 mg total) by mouth daily.    [DISCONTINUED] methylPREDNISolone (MEDROL) 4 MG TBPK tablet Take by mouth as directed for 6 days (Patient not  taking: Reported on 10/18/2019)    No facility-administered encounter medications on file as of 10/18/2019.    Surgical History: Past Surgical History:  Procedure Laterality Date   TUBAL LIGATION      Medical History: Past Medical History:  Diagnosis Date   Constipation    Hypertension    Pelvic pain in female    PMB (postmenopausal bleeding)    Postmenopausal bleeding 02/11/2015   Sleep apnea     Family History: Family History  Problem Relation Age of Onset   Ovarian cancer Sister    Colon cancer Brother    Asthma Maternal Aunt    Diabetes Maternal Aunt    Kidney disease Maternal Aunt    Heart disease Neg Hx     Social History: Social History   Socioeconomic History   Marital status: Married    Spouse name: Not on file   Number of children: Not on file   Years of education: Not on file   Highest education level: Not on file  Occupational History   Not on file  Tobacco Use   Smoking status: Never Smoker   Smokeless tobacco: Never Used  Substance and Sexual Activity   Alcohol use: Yes    Comment: occas   Drug use: No   Sexual activity: Yes    Birth control/protection: Surgical  Other Topics Concern   Not on file  Social History Narrative   Not on file   Social Determinants of Health   Financial Resource Strain:    Difficulty of Paying Living Expenses: Not on file  Food Insecurity:    Worried About Running Out of  Food in the Last Year: Not on file   Ran Out of Food in the Last Year: Not on file  Transportation Needs:    Lack of Transportation (Medical): Not on file   Lack of Transportation (Non-Medical): Not on file  Physical Activity:    Days of Exercise per Week: Not on file   Minutes of Exercise per Session: Not on file  Stress:    Feeling of Stress : Not on file  Social Connections:    Frequency of Communication with Friends and Family: Not on file   Frequency of Social Gatherings with Friends and Family: Not on  file   Attends Religious Services: Not on file   Active Member of Clubs or Organizations: Not on file   Attends Banker Meetings: Not on file   Marital Status: Not on file  Intimate Partner Violence:    Fear of Current or Ex-Partner: Not on file   Emotionally Abused: Not on file   Physically Abused: Not on file   Sexually Abused: Not on file    Vital Signs: Blood pressure 120/80, pulse 77, temperature (!) 97.2 F (36.2 C), resp. rate 16, height 4\' 11"  (1.499 m), weight 143 lb (64.9 kg), last menstrual period 01/09/2015, SpO2 97 %.  Examination: General Appearance: The patient is well-developed, well-nourished, and in no distress. Skin: Gross inspection of skin unremarkable. Head: normocephalic, no gross deformities. Eyes: no gross deformities noted. ENT: ears appear grossly normal no exudates. Neck: Supple. No thyromegaly. No LAD. Respiratory: Clear throughout. Cardiovascular: Normal S1 and S2 without murmur or rub. Extremities: No cyanosis. pulses are equal. Neurologic: Alert and oriented. No involuntary movements.  LABS: Recent Results (from the past 2160 hour(s))  UA/M w/rflx Culture, Routine     Status: Abnormal   Collection Time: 10/04/19 11:07 AM   Specimen: Urine   Urine  Result Value Ref Range   Specific Gravity, UA 1.019 1.005 - 1.030   pH, UA 5.0 5.0 - 7.5   Color, UA Yellow Yellow   Appearance Ur Turbid (A) Clear   Leukocytes,UA Trace (A) Negative   Protein,UA Negative Negative/Trace   Glucose, UA Negative Negative   Ketones, UA Negative Negative   RBC, UA Trace (A) Negative   Bilirubin, UA Negative Negative   Urobilinogen, Ur 0.2 0.2 - 1.0 mg/dL   Nitrite, UA Negative Negative   Microscopic Examination See below:     Comment: Microscopic was indicated and was performed.   Urinalysis Reflex Comment     Comment: This specimen has reflexed to a Urine Culture.  Microscopic Examination     Status: None   Collection Time: 10/04/19 11:07  AM   Urine  Result Value Ref Range   WBC, UA 0-5 0 - 5 /hpf   RBC None seen 0 - 2 /hpf   Epithelial Cells (non renal) 0-10 0 - 10 /hpf   Casts None seen None seen /lpf   Bacteria, UA None seen None seen/Few  Urine Culture, Reflex     Status: Abnormal   Collection Time: 10/04/19 11:07 AM   Urine  Result Value Ref Range   Urine Culture, Routine Final report (A)    Organism ID, Bacteria Escherichia coli (A)     Comment: Cefazolin <=4 ug/mL Cefazolin with an MIC <=16 predicts susceptibility to the oral agents cefaclor, cefdinir, cefpodoxime, cefprozil, cefuroxime, cephalexin, and loracarbef when used for therapy of uncomplicated urinary tract infections due to E. coli, Klebsiella pneumoniae, and Proteus mirabilis. 50,000-100,000 colony forming units  per mL    Antimicrobial Susceptibility Comment     Comment:       ** S = Susceptible; I = Intermediate; R = Resistant **                    P = Positive; N = Negative             MICS are expressed in micrograms per mL    Antibiotic                 RSLT#1    RSLT#2    RSLT#3    RSLT#4 Amoxicillin/Clavulanic Acid    S Ampicillin                     S Cefepime                       S Ceftriaxone                    S Cefuroxime                     S Ciprofloxacin                  S Ertapenem                      S Gentamicin                     S Imipenem                       S Levofloxacin                   S Meropenem                      S Nitrofurantoin                 S Piperacillin/Tazobactam        S Tetracycline                   S Tobramycin                     S Trimethoprim/Sulfa             S     Radiology: DG Cervical Spine Complete  Result Date: 09/04/2015 CLINICAL DATA:  Right neck pain radiating to right shoulder. No known injury. Symptoms for 6 months to 1 year. EXAM: CERVICAL SPINE - COMPLETE 4+ VIEW COMPARISON:  None. FINDINGS: Normal alignment. Disc spaces are maintained. Prevertebral soft tissues are normal.  No neural foraminal narrowing. Early degenerative facet disease bilaterally. IMPRESSION: No acute bony abnormality. Electronically Signed   By: Charlett Nose M.D.   On: 09/04/2015 14:52  DG Lumbar Spine Complete  Result Date: 09/04/2015 CLINICAL DATA:  Low right-sided back pain radiates to right leg for 6 months. No known injury. EXAM: LUMBAR SPINE - COMPLETE 4+ VIEW COMPARISON:  None. FINDINGS: There is no evidence of lumbar spine fracture. Alignment is normal. Intervertebral disc spaces are maintained. IMPRESSION: Negative. Electronically Signed   By: Kennith Center M.D.   On: 09/04/2015 14:52  Assessment and Plan: Patient Active Problem List   Diagnosis Date Noted   Encounter for general adult medical examination with abnormal findings 10/17/2019   Dysuria 10/17/2019  OSA (obstructive sleep apnea) 03/29/2019   Acute right-sided low back pain with right-sided sciatica 11/14/2018   Loud snoring 11/14/2018   Excessive daytime sleepiness 11/14/2018   Screening for colon cancer 11/14/2018   Essential hypertension 03/28/2017   Mixed hyperlipidemia 03/28/2017   Vitamin D deficiency 03/28/2017   Routine cervical smear 03/28/2017   Postmenopausal bleeding 02/11/2015   Family history of breast cancer 02/11/2015   Family history of ovarian cancer 02/11/2015   Family history of colon cancer 02/11/2015   1. OSA on CPAP Recent compliance download: poor compliance with 67%, she was unaware she should be using her CPAP all night. She routinely forgets to put CPAP on when she lies down to watch TV and will fall asleep without putting on CPAP. Encouraged her to put on CPAP when she lies down to relax before falling asleep to increase use amount. AHI well controlled on CPAP. Encouraged nightly compliance and increasing the amount of time she uses CPAP.  2. Essential hypertension HR and BP well controlled today. Continue with current therapy and routine monitoring.  General Counseling: I  have discussed the findings of the evaluation and examination with Harriett SineNancy.  I have also discussed any further diagnostic evaluation thatmay be needed or ordered today. Larraine verbalizes understanding of the findings of todays visit. We also reviewed her medications today and discussed drug interactions and side effects including but not limited excessive drowsiness and altered mental states. We also discussed that there is always a risk not just to her but also people around her. she has been encouraged to call the office with any questions or concerns that should arise related to todays visit.   Time spent: 20  I have personally obtained a history, examined the patient, evaluated laboratory and imaging results, formulated the assessment and plan and placed orders. This patient was seen by Brent Generalaylor Foster AGNP-C in Collaboration with Dr. Freda MunroSaadat Khan as a part of collaborative care agreement.    Yevonne PaxSaadat A Khan, MD [facility] Pulmonary and Critical Care Sleep medicine

## 2019-10-22 ENCOUNTER — Encounter: Payer: Self-pay | Admitting: Internal Medicine

## 2019-10-22 NOTE — Patient Instructions (Signed)

## 2019-10-25 ENCOUNTER — Other Ambulatory Visit: Payer: Self-pay

## 2019-10-25 ENCOUNTER — Other Ambulatory Visit: Payer: Self-pay | Admitting: Nurse Practitioner

## 2019-10-25 DIAGNOSIS — I10 Essential (primary) hypertension: Secondary | ICD-10-CM | POA: Diagnosis not present

## 2019-10-25 DIAGNOSIS — E782 Mixed hyperlipidemia: Secondary | ICD-10-CM | POA: Diagnosis not present

## 2019-10-25 DIAGNOSIS — E559 Vitamin D deficiency, unspecified: Secondary | ICD-10-CM | POA: Diagnosis not present

## 2019-10-25 DIAGNOSIS — Z0001 Encounter for general adult medical examination with abnormal findings: Secondary | ICD-10-CM | POA: Diagnosis not present

## 2019-10-26 LAB — COMPREHENSIVE METABOLIC PANEL
ALT: 17 IU/L (ref 0–32)
AST: 16 IU/L (ref 0–40)
Albumin/Globulin Ratio: 1.5 (ref 1.2–2.2)
Albumin: 4.9 g/dL (ref 3.8–4.9)
Alkaline Phosphatase: 77 IU/L (ref 44–121)
BUN/Creatinine Ratio: 16 (ref 9–23)
BUN: 12 mg/dL (ref 6–24)
Bilirubin Total: 0.4 mg/dL (ref 0.0–1.2)
CO2: 22 mmol/L (ref 20–29)
Calcium: 9.8 mg/dL (ref 8.7–10.2)
Chloride: 107 mmol/L — ABNORMAL HIGH (ref 96–106)
Creatinine, Ser: 0.75 mg/dL (ref 0.57–1.00)
GFR calc Af Amer: 104 mL/min/{1.73_m2} (ref >59)
GFR calc non Af Amer: 90 mL/min/{1.73_m2} (ref >59)
Globulin, Total: 3.2 g/dL (ref 1.5–4.5)
Glucose: 108 mg/dL — ABNORMAL HIGH (ref 65–99)
Potassium: 4.2 mmol/L (ref 3.5–5.2)
Sodium: 143 mmol/L (ref 134–144)
Total Protein: 8.1 g/dL (ref 6.0–8.5)

## 2019-10-26 LAB — TSH: TSH: 0.522 u[IU]/mL (ref 0.450–4.500)

## 2019-10-26 LAB — CBC
Hematocrit: 40.1 % (ref 34.0–46.6)
Hemoglobin: 13.4 g/dL (ref 11.1–15.9)
MCH: 27.9 pg (ref 26.6–33.0)
MCHC: 33.4 g/dL (ref 31.5–35.7)
MCV: 84 fL (ref 79–97)
Platelets: 370 10*3/uL (ref 150–450)
RBC: 4.8 x10E6/uL (ref 3.77–5.28)
RDW: 13.5 % (ref 11.7–15.4)
WBC: 6.9 10*3/uL (ref 3.4–10.8)

## 2019-10-26 LAB — LIPID PANEL WITH LDL/HDL RATIO
Cholesterol, Total: 205 mg/dL — ABNORMAL HIGH (ref 100–199)
HDL: 46 mg/dL (ref >39)
LDL Chol Calc (NIH): 136 mg/dL — ABNORMAL HIGH (ref 0–99)
LDL/HDL Ratio: 3 ratio (ref 0.0–3.2)
Triglycerides: 127 mg/dL (ref 0–149)
VLDL Cholesterol Cal: 23 mg/dL (ref 5–40)

## 2019-10-26 LAB — T4, FREE: Free T4: 0.89 ng/dL (ref 0.82–1.77)

## 2019-10-26 LAB — VITAMIN D 25 HYDROXY (VIT D DEFICIENCY, FRACTURES): Vit D, 25-Hydroxy: 63.2 ng/mL (ref 30.0–100.0)

## 2019-10-30 ENCOUNTER — Other Ambulatory Visit: Payer: Self-pay

## 2019-10-30 DIAGNOSIS — E559 Vitamin D deficiency, unspecified: Secondary | ICD-10-CM

## 2019-10-30 MED ORDER — ERGOCALCIFEROL 1.25 MG (50000 UT) PO CAPS: 50000.0000 [IU] | ORAL_CAPSULE | ORAL | 3 refills | Status: DC

## 2019-10-31 ENCOUNTER — Telehealth: Payer: Self-pay

## 2019-10-31 NOTE — Telephone Encounter (Signed)
Confirmed and screened for ov on 9/23 

## 2019-11-01 ENCOUNTER — Encounter: Payer: Self-pay | Admitting: Nurse Practitioner

## 2019-11-01 ENCOUNTER — Ambulatory Visit (INDEPENDENT_AMBULATORY_CARE_PROVIDER_SITE_OTHER): Payer: BC Managed Care – PPO | Admitting: Nurse Practitioner

## 2019-11-01 ENCOUNTER — Other Ambulatory Visit: Payer: Self-pay

## 2019-11-01 VITALS — BP 128/80 | HR 79 | Temp 97.5°F | Resp 16 | Ht 59.0 in | Wt 142.2 lb

## 2019-11-01 DIAGNOSIS — E782 Mixed hyperlipidemia: Secondary | ICD-10-CM

## 2019-11-01 DIAGNOSIS — N390 Urinary tract infection, site not specified: Secondary | ICD-10-CM

## 2019-11-01 DIAGNOSIS — I10 Essential (primary) hypertension: Secondary | ICD-10-CM | POA: Diagnosis not present

## 2019-11-01 DIAGNOSIS — R252 Cramp and spasm: Secondary | ICD-10-CM

## 2019-11-01 MED ORDER — SULFAMETHOXAZOLE-TRIMETHOPRIM 400-80 MG PO TABS: 1.0000 | ORAL_TABLET | Freq: Two times a day (BID) | ORAL | 0 refills | Status: DC

## 2019-11-01 MED ORDER — CYCLOBENZAPRINE HCL 5 MG PO TABS: 5.0000 mg | ORAL_TABLET | Freq: Two times a day (BID) | ORAL | 1 refills | Status: DC | PRN

## 2019-11-01 NOTE — Progress Notes (Signed)
[facility] [address] [place], [place] [address]  Internal MEDICINE  Office Visit Note  Patient Name: Barbara Cantrell  [id number]  [id number]  Date of Service: 11/17/2019  Chief Complaint  Patient presents with  . Follow-up    increased norvasc  . Hypertension    The patient is here for routine follow up. Her norvasc was increased to 5mg  at her last visit. Her blood pressure is improved and she has tolerated this increase well. She also had routine labs done after her wellness visit. Cholesterol, specifically LDL and total cholesterol continue to be elevated but much improved since she was last seen. She reports having a great deal of lower back pain. She describes the pain as a "catch" in the muscles along the lowest aspect of the back. The patient states that this gets more severe when she is bending and twisting at the waist. She currently denies urinary frequency or dysuria. Recent labs do show presence of E.coli uti. She has not completed antibiotics at this time .      Current Medication: Outpatient Encounter Medications as of 11/01/2019  Medication Sig Note  . amLODipine (NORVASC) 5 MG tablet Take 2 tablets po QD for blood pressure   . ergocalciferol (DRISDOL) 1.25 MG (50000 UT) capsule Take 1 capsule (50,000 Units total) by mouth once a week.   . fluticasone (FLONASE) 50 MCG/ACT nasal spray Place 1 spray into both nostrils.   11/03/2019 ibuprofen (ADVIL,MOTRIN) 600 MG tablet TAKE 1 TABLET(S) BY MOUTH THREE TIMES A DAY AS NEEDED FOR PAIN 02/11/2015: Received from: External Pharmacy  . montelukast (SINGULAIR) 10 MG tablet TAKE 1 TABLET BY MOUTH EVERY DAY FOR SINUSES   . rosuvastatin (CRESTOR) 5 MG tablet Take 1 tablet (5 mg total) by mouth daily.   . cyclobenzaprine (FLEXERIL) 5 MG tablet Take 1 tablet (5 mg total) by mouth 2 (two) times daily as needed for muscle spasms.   04/11/2015 sulfamethoxazole-trimethoprim (BACTRIM) 400-80 MG tablet Take 1 tablet by mouth 2 (two) times daily.     No facility-administered encounter medications on file as of 11/01/2019.    Surgical History: Past Surgical History:  Procedure Laterality Date  . TUBAL LIGATION      Medical History: Past Medical History:  Diagnosis Date  . Constipation   . Hypertension   . Pelvic pain in female   . PMB (postmenopausal bleeding)   . Postmenopausal bleeding 02/11/2015  . Sleep apnea     Family History: Family History  Problem Relation Age of Onset  . Ovarian cancer Sister   . Colon cancer Brother   . Asthma Maternal Aunt   . Diabetes Maternal Aunt   . Kidney disease Maternal Aunt   . Heart disease Neg Hx     Social History   Socioeconomic History  . Marital status: Married    Spouse name: Not on file  . Number of children: Not on file  . Years of education: Not on file  . Highest education level: Not on file  Occupational History  . Not on file  Tobacco Use  . Smoking status: Never Smoker  . Smokeless tobacco: Never Used  Substance and Sexual Activity  . Alcohol use: Yes    Comment: occas  . Drug use: No  . Sexual activity: Yes    Birth control/protection: Surgical  Other Topics Concern  . Not on file  Social History Narrative  . Not on file   Social Determinants of Health   Financial Resource  Strain:   . Difficulty of Paying Living Expenses: Not on file  Food Insecurity:   . Worried About Programme researcher, broadcasting/film/video in the Last Year: Not on file  . Ran Out of Food in the Last Year: Not on file  Transportation Needs:   . Lack of Transportation (Medical): Not on file  . Lack of Transportation (Non-Medical): Not on file  Physical Activity:   . Days of Exercise per Week: Not on file  . Minutes of Exercise per Session: Not on file  Stress:   . Feeling of Stress : Not on file  Social Connections:   . Frequency of Communication with Friends and Family: Not on file  . Frequency of Social Gatherings with Friends and Family: Not on file  . Attends Religious Services: Not on  file  . Active Member of Clubs or Organizations: Not on file  . Attends Banker Meetings: Not on file  . Marital Status: Not on file  Intimate Partner Violence:   . Fear of Current or Ex-Partner: Not on file  . Emotionally Abused: Not on file  . Physically Abused: Not on file  . Sexually Abused: Not on file      Review of Systems  Constitutional: Negative for chills, fatigue and unexpected weight change.  HENT: Negative for congestion, postnasal drip, rhinorrhea, sneezing and sore throat.   Respiratory: Negative for cough, chest tightness and shortness of breath.   Cardiovascular: Negative for chest pain and palpitations.       Improved blood pressure since her most recent visit.   Gastrointestinal: Negative for abdominal pain, constipation, diarrhea, nausea and vomiting.  Endocrine: Negative for cold intolerance, heat intolerance, polydipsia and polyuria.  Genitourinary: Positive for flank pain. Negative for dysuria, frequency, hematuria and urgency.  Musculoskeletal: Positive for back pain and myalgias. Negative for arthralgias, joint swelling and neck pain.  Skin: Negative for rash.  Allergic/Immunologic: Negative for environmental allergies.  Neurological: Negative for dizziness, tremors, numbness and headaches.  Hematological: Negative for adenopathy. Does not bruise/bleed easily.  Psychiatric/Behavioral: Negative for behavioral problems (Depression), sleep disturbance and suicidal ideas. The patient is not nervous/anxious.     Today's Vitals   11/01/19 1023  BP: 128/80  Pulse: 79  Resp: 16  Temp: (!) 97.5 F (36.4 C)  SpO2: 99%  Weight: 142 lb 3.2 oz (64.5 kg)  Height: 4\' 11"  (1.499 m)   Body mass index is 28.72 kg/m.  Physical Exam Vitals and nursing note reviewed.  Constitutional:      General: She is not in acute distress.    Appearance: Normal appearance. She is well-developed. She is not diaphoretic.  HENT:     Head: Normocephalic and  atraumatic.     Nose: Nose normal.     Mouth/Throat:     Pharynx: No oropharyngeal exudate.  Eyes:     Pupils: Pupils are equal, round, and reactive to light.  Neck:     Thyroid: No thyromegaly.     Vascular: No JVD.     Trachea: No tracheal deviation.  Cardiovascular:     Rate and Rhythm: Normal rate and regular rhythm.     Heart sounds: Normal heart sounds. No murmur heard.  No friction rub. No gallop.   Pulmonary:     Effort: Pulmonary effort is normal. No respiratory distress.     Breath sounds: Normal breath sounds. No wheezing or rales.  Chest:     Chest wall: No tenderness.  Abdominal:     General:  Bowel sounds are normal.     Palpations: Abdomen is soft.     Tenderness: There is no abdominal tenderness.  Musculoskeletal:        General: Normal range of motion.     Cervical back: Normal range of motion and neck supple.     Comments: There is lower back pain with moderate palpation. This is more severe with bending and twisting at the waist. There are no palpable abnormalities or deformities noted at this time.   Lymphadenopathy:     Cervical: No cervical adenopathy.  Skin:    General: Skin is warm and dry.  Neurological:     Mental Status: She is alert and oriented to person, place, and time.     Cranial Nerves: No cranial nerve deficit.  Psychiatric:        Behavior: Behavior normal.        Thought Content: Thought content normal.        Judgment: Judgment normal.    Assessment/Plan: 1. Essential hypertension Improved. Continue bp medication as prescribed.   2. Mixed hyperlipidemia Improved. Continue rosuvastatin as prescribed. Will monitor closely.   3. Muscle cramps May take flexeril 5mg  up to twice daily as needed for muscle pain/spasms. Encouraged her to apply heating pad to lower back. Consider using OTC tylenol and ibuprofen as needed and as indicated. - cyclobenzaprine (FLEXERIL) 5 MG tablet; Take 1 tablet (5 mg total) by mouth 2 (two) times daily as  needed for muscle spasms.  Dispense: 45 tablet; Refill: 1  4. Urinary tract infection without hematuria, site unspecified Start bactrim twice daily for 7 days.  - sulfamethoxazole-trimethoprim (BACTRIM) 400-80 MG tablet; Take 1 tablet by mouth 2 (two) times daily.  Dispense: 14 tablet; Refill: 0  General Counseling: verbalizes understanding of the findings of todays visit and agrees with plan of treatment. I have discussed any further diagnostic evaluation that may be needed or ordered today. We also reviewed her medications today. she has been encouraged to call the office with any questions or concerns that should arise related to todays visit.   This patient was seen by Harriett Sine FNP Collaboration with Dr Vincent Gros as a part of collaborative care agreement  Meds ordered this encounter  Medications  . sulfamethoxazole-trimethoprim (BACTRIM) 400-80 MG tablet    Sig: Take 1 tablet by mouth 2 (two) times daily.    Dispense:  14 tablet    Refill:  0    Order Specific Question:   Supervising Provider    Answer:   Lyndon Code [[id number]]  . cyclobenzaprine (FLEXERIL) 5 MG tablet    Sig: Take 1 tablet (5 mg total) by mouth 2 (two) times daily as needed for muscle spasms.    Dispense:  45 tablet    Refill:  1    Order Specific Question:   Supervising Provider    Answer:   Lyndon Code [[id number]]    Total time spent: 25 Minutes   Time spent includes review of chart, medications, test results, and follow up plan with the patient.      Dr Lyndon Code Internal medicine

## 2019-11-17 DIAGNOSIS — R252 Cramp and spasm: Secondary | ICD-10-CM | POA: Insufficient documentation

## 2019-11-17 DIAGNOSIS — N390 Urinary tract infection, site not specified: Secondary | ICD-10-CM | POA: Insufficient documentation

## 2019-12-27 ENCOUNTER — Other Ambulatory Visit: Payer: Self-pay

## 2019-12-27 DIAGNOSIS — E782 Mixed hyperlipidemia: Secondary | ICD-10-CM

## 2019-12-27 MED ORDER — ROSUVASTATIN CALCIUM 5 MG PO TABS: 5.0000 mg | ORAL_TABLET | Freq: Every day | ORAL | 3 refills | Status: DC

## 2020-01-21 ENCOUNTER — Other Ambulatory Visit: Payer: Self-pay

## 2020-01-21 DIAGNOSIS — I10 Essential (primary) hypertension: Secondary | ICD-10-CM

## 2020-01-21 MED ORDER — AMLODIPINE BESYLATE 5 MG PO TABS: ORAL_TABLET | ORAL | 3 refills | Status: DC

## 2020-02-18 ENCOUNTER — Other Ambulatory Visit: Payer: Self-pay

## 2020-02-18 DIAGNOSIS — E559 Vitamin D deficiency, unspecified: Secondary | ICD-10-CM

## 2020-02-18 MED ORDER — ERGOCALCIFEROL 1.25 MG (50000 UT) PO CAPS: 50000.0000 [IU] | ORAL_CAPSULE | ORAL | 3 refills | Status: DC

## 2020-03-04 ENCOUNTER — Ambulatory Visit: Payer: BC Managed Care – PPO | Admitting: Hospice and Palliative Medicine

## 2020-03-12 ENCOUNTER — Ambulatory Visit: Payer: BC Managed Care – PPO | Admitting: Hospice and Palliative Medicine

## 2020-03-12 ENCOUNTER — Encounter: Payer: Self-pay | Admitting: Hospice and Palliative Medicine

## 2020-03-12 VITALS — BP 132/76 | HR 86 | Temp 97.6°F | Resp 16 | Ht 59.0 in | Wt 142.0 lb

## 2020-03-12 DIAGNOSIS — E782 Mixed hyperlipidemia: Secondary | ICD-10-CM | POA: Diagnosis not present

## 2020-03-12 DIAGNOSIS — Z9989 Dependence on other enabling machines and devices: Secondary | ICD-10-CM

## 2020-03-12 DIAGNOSIS — Z1231 Encounter for screening mammogram for malignant neoplasm of breast: Secondary | ICD-10-CM | POA: Diagnosis not present

## 2020-03-12 DIAGNOSIS — G4733 Obstructive sleep apnea (adult) (pediatric): Secondary | ICD-10-CM

## 2020-03-12 DIAGNOSIS — I10 Essential (primary) hypertension: Secondary | ICD-10-CM

## 2020-03-12 NOTE — Progress Notes (Signed)
[facility] [address] [place], [place] [address]  Internal MEDICINE  Office Visit Note  Patient Name: Barbara Cantrell  [id number]  [id number]  Date of Service: 03/15/2020  Chief Complaint  Patient presents with  . Follow-up  . Hypertension    HPI Patient is her for routine follow-up Overall, things have been going well C/o lower back pain muscle spasms--treated with flexeril, pain has significantly improved at this time, has stopped using muscle relaxants at this time BP readings at home have remained well controlled since starting increased dose of amlodipine  Due for screening mammogram  Current Medication: Outpatient Encounter Medications as of 03/12/2020  Medication Sig Note  . amLODipine (NORVASC) 5 MG tablet Take 2 tablets po QD for blood pressure   . cyclobenzaprine (FLEXERIL) 5 MG tablet Take 1 tablet (5 mg total) by mouth 2 (two) times daily as needed for muscle spasms.   . ergocalciferol (DRISDOL) 1.25 MG (50000 UT) capsule Take 1 capsule (50,000 Units total) by mouth once a week.   . fluticasone (FLONASE) 50 MCG/ACT nasal spray Place 1 spray into both nostrils.   Marland Kitchen ibuprofen (ADVIL,MOTRIN) 600 MG tablet TAKE 1 TABLET(S) BY MOUTH THREE TIMES A DAY AS NEEDED FOR PAIN 02/11/2015: Received from: External Pharmacy  . montelukast (SINGULAIR) 10 MG tablet TAKE 1 TABLET BY MOUTH EVERY DAY FOR SINUSES   . rosuvastatin (CRESTOR) 5 MG tablet Take 1 tablet (5 mg total) by mouth daily.   . [DISCONTINUED] sulfamethoxazole-trimethoprim (BACTRIM) 400-80 MG tablet Take 1 tablet by mouth 2 (two) times daily.    No facility-administered encounter medications on file as of 03/12/2020.    Surgical History: Past Surgical History:  Procedure Laterality Date  . TUBAL LIGATION      Medical History: Past Medical History:  Diagnosis Date  . Constipation   . Hypertension   . Pelvic pain in female   . PMB (postmenopausal bleeding)   . Postmenopausal bleeding 02/11/2015  .  Sleep apnea     Family History: Family History  Problem Relation Age of Onset  . Ovarian cancer Sister   . Colon cancer Brother   . Asthma Maternal Aunt   . Diabetes Maternal Aunt   . Kidney disease Maternal Aunt   . Heart disease Neg Hx     Social History   Socioeconomic History  . Marital status: Married    Spouse name: Not on file  . Number of children: Not on file  . Years of education: Not on file  . Highest education level: Not on file  Occupational History  . Not on file  Tobacco Use  . Smoking status: Never Smoker  . Smokeless tobacco: Never Used  Substance and Sexual Activity  . Alcohol use: Yes    Comment: occas  . Drug use: No  . Sexual activity: Yes    Birth control/protection: Surgical  Other Topics Concern  . Not on file  Social History Narrative  . Not on file   Social Determinants of Health   Financial Resource Strain: Not on file  Food Insecurity: Not on file  Transportation Needs: Not on file  Physical Activity: Not on file  Stress: Not on file  Social Connections: Not on file  Intimate Partner Violence: Not on file      Review of Systems  Constitutional: Negative for chills, diaphoresis and fatigue.  HENT: Negative for ear pain, postnasal drip and sinus pressure.   Eyes: Negative for photophobia, discharge, redness, itching and visual disturbance.  Respiratory: Negative for cough, shortness of breath and wheezing.   Cardiovascular: Negative for chest pain, palpitations and leg swelling.  Gastrointestinal: Negative for abdominal pain, constipation, diarrhea, nausea and vomiting.  Genitourinary: Negative for dysuria and flank pain.  Musculoskeletal: Negative for arthralgias, back pain, gait problem and neck pain.  Skin: Negative for color change.  Allergic/Immunologic: Negative for environmental allergies and food allergies.  Neurological: Negative for dizziness and headaches.  Hematological: Does not bruise/bleed easily.   Psychiatric/Behavioral: Negative for agitation, behavioral problems (depression) and hallucinations.    Vital Signs: BP 132/76   Pulse 86   Temp 97.6 F (36.4 C)   Resp 16   Ht 4\' 11"  (1.499 m)   Wt 142 lb (64.4 kg)   LMP 01/09/2015 (Approximate)   SpO2 98%   BMI 28.68 kg/m    Physical Exam Vitals reviewed.  Constitutional:      Appearance: Normal appearance. She is normal weight.  Cardiovascular:     Rate and Rhythm: Normal rate and regular rhythm.     Pulses: Normal pulses.     Heart sounds: Normal heart sounds.  Pulmonary:     Effort: Pulmonary effort is normal.     Breath sounds: Normal breath sounds.  Abdominal:     General: Abdomen is flat.  Musculoskeletal:        General: Normal range of motion.     Cervical back: Normal range of motion.  Skin:    General: Skin is warm.  Neurological:     General: No focal deficit present.     Mental Status: She is alert and oriented to person, place, and time. Mental status is at baseline.  Psychiatric:        Mood and Affect: Mood normal.        Behavior: Behavior normal.        Thought Content: Thought content normal.        Judgment: Judgment normal.    Assessment/Plan: 1. Essential hypertension BP and HR remain well controlled on current therapy, continue to monitor  2. Mixed hyperlipidemia Crestor 5 mg daily, will need further vascular studies  3. OSA on CPAP Continue with nightly CPAP compliance   4. Encounter for screening mammogram for malignant neoplasm of breast - MM Digital Screening; Future  General Counseling: 14/02/2014 understanding of the findings of todays visit and agrees with plan of treatment. I have discussed any further diagnostic evaluation that may be needed or ordered today. We also reviewed her medications today. she has been encouraged to call the office with any questions or concerns that should arise related to todays visit.    Orders Placed This Encounter  Procedures  . MM  Digital Screening    Time spent: 30 Minutes Time spent includes review of chart, medications, test results and follow-up plan with the patient.  This patient was seen by Lily Peer AGNP-C in Collaboration with Dr Leeanne Deed as a part of collaborative care agreement     Lyndon Code. Katrinka Herbison AGNP-C Internal medicine

## 2020-03-17 DIAGNOSIS — Z20822 Contact with and (suspected) exposure to covid-19: Secondary | ICD-10-CM | POA: Diagnosis not present

## 2020-03-17 DIAGNOSIS — Z03818 Encounter for observation for suspected exposure to other biological agents ruled out: Secondary | ICD-10-CM | POA: Diagnosis not present

## 2020-03-26 ENCOUNTER — Ambulatory Visit: Payer: BC Managed Care – PPO

## 2020-03-27 ENCOUNTER — Ambulatory Visit: Payer: BC Managed Care – PPO | Admitting: Hospice and Palliative Medicine

## 2020-04-20 ENCOUNTER — Other Ambulatory Visit: Payer: Self-pay | Admitting: Nurse Practitioner

## 2020-04-20 DIAGNOSIS — E782 Mixed hyperlipidemia: Secondary | ICD-10-CM

## 2020-04-30 ENCOUNTER — Ambulatory Visit: Payer: BC Managed Care – PPO

## 2020-04-30 ENCOUNTER — Other Ambulatory Visit: Payer: Self-pay

## 2020-04-30 DIAGNOSIS — G4733 Obstructive sleep apnea (adult) (pediatric): Secondary | ICD-10-CM

## 2020-04-30 NOTE — Progress Notes (Signed)
95 percentile pressure 8   95th percentile leak 1.0   apnea index 0.3 /hr  apnea-hypopnea index  0.6 /hr   total days used  >4 hr 84 days  total days used <4 hr 1 days  Total compliance 93 percent  She is doing very good no problems or questions at this time

## 2020-05-01 DIAGNOSIS — M549 Dorsalgia, unspecified: Secondary | ICD-10-CM | POA: Diagnosis not present

## 2020-05-01 DIAGNOSIS — R350 Frequency of micturition: Secondary | ICD-10-CM | POA: Diagnosis not present

## 2020-05-13 ENCOUNTER — Ambulatory Visit: Payer: BC Managed Care – PPO | Admitting: Physician Assistant

## 2020-05-13 ENCOUNTER — Other Ambulatory Visit: Payer: Self-pay

## 2020-05-13 ENCOUNTER — Encounter: Payer: Self-pay | Admitting: Physician Assistant

## 2020-05-13 DIAGNOSIS — Z7189 Other specified counseling: Secondary | ICD-10-CM

## 2020-05-13 DIAGNOSIS — I10 Essential (primary) hypertension: Secondary | ICD-10-CM

## 2020-05-13 DIAGNOSIS — E782 Mixed hyperlipidemia: Secondary | ICD-10-CM

## 2020-05-13 DIAGNOSIS — Z9989 Dependence on other enabling machines and devices: Secondary | ICD-10-CM

## 2020-05-13 DIAGNOSIS — G4733 Obstructive sleep apnea (adult) (pediatric): Secondary | ICD-10-CM | POA: Diagnosis not present

## 2020-05-13 MED ORDER — ROSUVASTATIN CALCIUM 5 MG PO TABS: 5.0000 mg | ORAL_TABLET | Freq: Every day | ORAL | 3 refills | Status: DC

## 2020-05-13 NOTE — Progress Notes (Signed)
[facility] [address] [place], [place] [address]  Pulmonary Sleep Medicine   Office Visit Note  Patient Name: Barbara Cantrell DOB: 11-Sep-1965 MRN [id number]  Date of Service: 05/14/2020  Complaints/HPI: Pt is here for pulmonary f/u for CPAP. She is tolerating the machine well and cleaning it regularly. Feels more rested after wearing CPAP. Denies any daytime sleepiness, dry mouth/nose, or headaches. She has no complaints today. Download showed 93% compliance with AHI of 0.6.  ROS  General: (-) fever, (-) chills, (-) night sweats, (-) weakness Skin: (-) rashes, (-) itching,. Eyes: (-) visual changes, (-) redness, (-) itching. Nose and Sinuses: (-) nasal stuffiness or itchiness, (-) postnasal drip, (-) nosebleeds, (-) sinus trouble. Mouth and Throat: (-) sore throat, (-) hoarseness. Neck: (-) swollen glands, (-) enlarged thyroid, (-) neck pain. Respiratory: - cough, (-) bloody sputum, - shortness of breath, - wheezing. Cardiovascular: - ankle swelling, (-) chest pain. Lymphatic: (-) lymph node enlargement. Neurologic: (-) numbness, (-) tingling. Psychiatric: (-) anxiety, (-) depression   Current Medication: Outpatient Encounter Medications as of 05/13/2020  Medication Sig Note  . amLODipine (NORVASC) 5 MG tablet Take 2 tablets po QD for blood pressure   . cyclobenzaprine (FLEXERIL) 5 MG tablet Take 1 tablet (5 mg total) by mouth 2 (two) times daily as needed for muscle spasms.   . ergocalciferol (DRISDOL) 1.25 MG (50000 UT) capsule Take 1 capsule (50,000 Units total) by mouth once a week.   . fluticasone (FLONASE) 50 MCG/ACT nasal spray Place 1 spray into both nostrils.   Marland Kitchen ibuprofen (ADVIL,MOTRIN) 600 MG tablet TAKE 1 TABLET(S) BY MOUTH THREE TIMES A DAY AS NEEDED FOR PAIN 02/11/2015: Received from: External Pharmacy  . montelukast (SINGULAIR) 10 MG tablet TAKE 1 TABLET BY MOUTH EVERY DAY FOR SINUSES   . [DISCONTINUED] rosuvastatin (CRESTOR) 5 MG tablet Take 1 tablet  (5 mg total) by mouth daily.   . rosuvastatin (CRESTOR) 5 MG tablet Take 1 tablet (5 mg total) by mouth daily.    No facility-administered encounter medications on file as of 05/13/2020.    Surgical History: Past Surgical History:  Procedure Laterality Date  . TUBAL LIGATION      Medical History: Past Medical History:  Diagnosis Date  . Constipation   . Hypertension   . Pelvic pain in female   . PMB (postmenopausal bleeding)   . Postmenopausal bleeding 02/11/2015  . Sleep apnea     Family History: Family History  Problem Relation Age of Onset  . Ovarian cancer Sister   . Colon cancer Brother   . Asthma Maternal Aunt   . Diabetes Maternal Aunt   . Kidney disease Maternal Aunt   . Heart disease Neg Hx     Social History: Social History   Socioeconomic History  . Marital status: Married    Spouse name: Not on file  . Number of children: Not on file  . Years of education: Not on file  . Highest education level: Not on file  Occupational History  . Not on file  Tobacco Use  . Smoking status: Never Smoker  . Smokeless tobacco: Never Used  Substance and Sexual Activity  . Alcohol use: Yes    Comment: occas  . Drug use: No  . Sexual activity: Yes    Birth control/protection: Surgical  Other Topics Concern  . Not on file  Social History Narrative  . Not on file   Social Determinants of Health   Financial Resource Strain: Not on file  Food Insecurity: Not on file  Transportation Needs: Not on file  Physical Activity: Not on file  Stress: Not on file  Social Connections: Not on file  Intimate Partner Violence: Not on file    Vital Signs: Blood pressure 134/76, pulse 80, temperature 98.5 F (36.9 C), resp. rate 16, height 4\' 11"  (1.499 m), weight 146 lb 6.4 oz (66.4 kg), last menstrual period 01/09/2015, SpO2 98 %.  Examination: General Appearance: The patient is well-developed, well-nourished, and in no distress. Skin: Gross inspection of skin  unremarkable. Head: normocephalic, no gross deformities. Eyes: no gross deformities noted. ENT: ears appear grossly normal no exudates. Neck: Supple. No thyromegaly. No LAD. Respiratory: Lungs clear to auscultation bilaterally. Cardiovascular: Normal S1 and S2 without murmur or rub. Extremities: No cyanosis. pulses are equal. Neurologic: Alert and oriented. No involuntary movements.  LABS: No results found for this or any previous visit (from the past 2160 hour(s)).  Radiology: DG Cervical Spine Complete  Result Date: 09/04/2015 CLINICAL DATA:  Right neck pain radiating to right shoulder. No known injury. Symptoms for 6 months to 1 year. EXAM: CERVICAL SPINE - COMPLETE 4+ VIEW COMPARISON:  None. FINDINGS: Normal alignment. Disc spaces are maintained. Prevertebral soft tissues are normal. No neural foraminal narrowing. Early degenerative facet disease bilaterally. IMPRESSION: No acute bony abnormality. Electronically Signed   By: 09/06/2015 M.D.   On: 09/04/2015 14:52  DG Lumbar Spine Complete  Result Date: 09/04/2015 CLINICAL DATA:  Low right-sided back pain radiates to right leg for 6 months. No known injury. EXAM: LUMBAR SPINE - COMPLETE 4+ VIEW COMPARISON:  None. FINDINGS: There is no evidence of lumbar spine fracture. Alignment is normal. Intervertebral disc spaces are maintained. IMPRESSION: Negative. Electronically Signed   By: 09/06/2015 M.D.   On: 09/04/2015 14:52   No results found.  No results found.    Assessment and Plan: Patient Active Problem List   Diagnosis Date Noted  . Muscle cramps 11/17/2019  . Urinary tract infection without hematuria 11/17/2019  . Encounter for general adult medical examination with abnormal findings 10/17/2019  . Dysuria 10/17/2019  . OSA (obstructive sleep apnea) 03/29/2019  . Acute right-sided low back pain with right-sided sciatica 11/14/2018  . Loud snoring 11/14/2018  . Excessive daytime sleepiness 11/14/2018  . Screening for  colon cancer 11/14/2018  . Essential hypertension 03/28/2017  . Mixed hyperlipidemia 03/28/2017  . Vitamin D deficiency 03/28/2017  . Routine cervical smear 03/28/2017  . Postmenopausal bleeding 02/11/2015  . Family history of breast cancer 02/11/2015  . Family history of ovarian cancer 02/11/2015  . Family history of colon cancer 02/11/2015    1. OSA on CPAP Continue nightly use  2. CPAP use counseling CPAP couseling-Discussed importance of adequate CPAP use as well as proper care and cleaning techniques of machine and all supplies.  3. Essential hypertension Stable, Continue current medication and f/u with PCP.  4. Mixed hyperlipidemia - rosuvastatin (CRESTOR) 5 MG tablet; Take 1 tablet (5 mg total) by mouth daily.  Dispense: 30 tablet; Refill: 3   General Counseling: I have discussed the findings of the evaluation and examination with 04/11/2015.  I have also discussed any further diagnostic evaluation thatmay be needed or ordered today. Barbara Cantrell verbalizes understanding of the findings of todays visit. We also reviewed her medications today and discussed drug interactions and side effects including but not limited excessive drowsiness and altered mental states. We also discussed that there is always a risk not just to her but also people  around her. she has been encouraged to call the office with any questions or concerns that should arise related to todays visit.  No orders of the defined types were placed in this encounter.    Time spent: 25  I have personally obtained a history, examined the patient, evaluated laboratory and imaging results, formulated the assessment and plan and placed orders. This patient was seen by Lynn Ito, PA-C in collaboration with Dr. Freda Munro as a part of collaborative care agreement.     Yevonne Pax, MD [facility] Pulmonary and Critical Care Sleep medicine

## 2020-05-14 NOTE — Patient Instructions (Signed)

## 2020-05-20 ENCOUNTER — Other Ambulatory Visit: Payer: Self-pay | Admitting: Nurse Practitioner

## 2020-05-20 DIAGNOSIS — I10 Essential (primary) hypertension: Secondary | ICD-10-CM

## 2020-06-07 ENCOUNTER — Other Ambulatory Visit: Payer: Self-pay | Admitting: Nurse Practitioner

## 2020-06-07 DIAGNOSIS — I10 Essential (primary) hypertension: Secondary | ICD-10-CM

## 2020-06-18 ENCOUNTER — Other Ambulatory Visit: Payer: Self-pay | Admitting: Nurse Practitioner

## 2020-06-18 DIAGNOSIS — E559 Vitamin D deficiency, unspecified: Secondary | ICD-10-CM

## 2020-06-24 ENCOUNTER — Other Ambulatory Visit: Payer: Self-pay

## 2020-06-24 DIAGNOSIS — I10 Essential (primary) hypertension: Secondary | ICD-10-CM

## 2020-06-24 MED ORDER — AMLODIPINE BESYLATE 5 MG PO TABS: ORAL_TABLET | ORAL | 3 refills | Status: DC

## 2020-06-25 DIAGNOSIS — G4733 Obstructive sleep apnea (adult) (pediatric): Secondary | ICD-10-CM | POA: Diagnosis not present

## 2020-07-28 DIAGNOSIS — Z1231 Encounter for screening mammogram for malignant neoplasm of breast: Secondary | ICD-10-CM | POA: Diagnosis not present

## 2020-09-13 ENCOUNTER — Other Ambulatory Visit: Payer: Self-pay | Admitting: Internal Medicine

## 2020-09-13 DIAGNOSIS — E782 Mixed hyperlipidemia: Secondary | ICD-10-CM

## 2020-09-23 DIAGNOSIS — G4733 Obstructive sleep apnea (adult) (pediatric): Secondary | ICD-10-CM | POA: Diagnosis not present

## 2020-10-09 ENCOUNTER — Encounter: Payer: Self-pay | Admitting: Nurse Practitioner

## 2020-10-09 ENCOUNTER — Other Ambulatory Visit: Payer: Self-pay

## 2020-10-09 ENCOUNTER — Ambulatory Visit (INDEPENDENT_AMBULATORY_CARE_PROVIDER_SITE_OTHER): Payer: BC Managed Care – PPO | Admitting: Internal Medicine

## 2020-10-09 ENCOUNTER — Telehealth: Payer: Self-pay

## 2020-10-09 VITALS — BP 124/80 | HR 61 | Temp 97.8°F | Resp 16 | Ht 59.0 in | Wt 146.0 lb

## 2020-10-09 DIAGNOSIS — R300 Dysuria: Secondary | ICD-10-CM | POA: Diagnosis not present

## 2020-10-09 DIAGNOSIS — G4733 Obstructive sleep apnea (adult) (pediatric): Secondary | ICD-10-CM

## 2020-10-09 DIAGNOSIS — I10 Essential (primary) hypertension: Secondary | ICD-10-CM

## 2020-10-09 DIAGNOSIS — Z0001 Encounter for general adult medical examination with abnormal findings: Secondary | ICD-10-CM

## 2020-10-09 DIAGNOSIS — Z1211 Encounter for screening for malignant neoplasm of colon: Secondary | ICD-10-CM

## 2020-10-09 DIAGNOSIS — Z9989 Dependence on other enabling machines and devices: Secondary | ICD-10-CM

## 2020-10-09 DIAGNOSIS — E782 Mixed hyperlipidemia: Secondary | ICD-10-CM | POA: Diagnosis not present

## 2020-10-09 DIAGNOSIS — E2839 Other primary ovarian failure: Secondary | ICD-10-CM | POA: Diagnosis not present

## 2020-10-09 MED ORDER — TRIAMTERENE-HCTZ 37.5-25 MG PO TABS: 1.0000 | ORAL_TABLET | Freq: Every day | ORAL | 3 refills | Status: DC

## 2020-10-09 MED ORDER — AMLODIPINE BESYLATE 5 MG PO TABS: ORAL_TABLET | ORAL | 3 refills | Status: DC

## 2020-10-09 MED ORDER — ROSUVASTATIN CALCIUM 5 MG PO TABS: 5.0000 mg | ORAL_TABLET | Freq: Every day | ORAL | 1 refills | Status: DC

## 2020-10-09 NOTE — Telephone Encounter (Signed)
Received fax back from [facility] stating patient is not seen in their office.

## 2020-10-09 NOTE — Progress Notes (Addendum)
[facility] [address] [place], [place] [address]  Internal MEDICINE  Office Visit Note  Patient Name: Barbara Cantrell  [id number]  [id number]  Date of Service: 10/15/2020  Chief Complaint  Patient presents with   Annual Exam   Hypertension     HPI Pt is here for routine health maintenance examination.  She feels well Patient traveled [place] to [place] her dad, and she noticed that her legs were swollen during the flight, patient is on amlodipine 10 mg once a day blood pressure is under good control. Patient has OSA on CPAP compliance is very good. Patient has hyperlipidemia on Crestor. She admits to having colonoscopy many years ago I do not see any report in her chart we will contact her gastroenterologist Patient is due for her BMD. Current Medication: Outpatient Encounter Medications as of 10/09/2020  Medication Sig Note   cyclobenzaprine (FLEXERIL) 5 MG tablet Take 1 tablet (5 mg total) by mouth 2 (two) times daily as needed for muscle spasms.    ergocalciferol (DRISDOL) 1.25 MG (50000 UT) capsule Take 1 capsule (50,000 Units total) by mouth once a week.    fluticasone (FLONASE) 50 MCG/ACT nasal spray Place 1 spray into both nostrils.    ibuprofen (ADVIL,MOTRIN) 600 MG tablet TAKE 1 TABLET(S) BY MOUTH THREE TIMES A DAY AS NEEDED FOR PAIN 02/11/2015: Received from: External Pharmacy   triamterene-hydrochlorothiazide (MAXZIDE-25) 37.5-25 MG tablet Take 1 tablet by mouth daily.    [DISCONTINUED] amLODipine (NORVASC) 5 MG tablet Take 2 tablets po QD for blood pressure    [DISCONTINUED] montelukast (SINGULAIR) 10 MG tablet TAKE 1 TABLET BY MOUTH EVERY DAY FOR SINUSES    [DISCONTINUED] rosuvastatin (CRESTOR) 5 MG tablet TAKE 1 TABLET (5 MG TOTAL) BY MOUTH DAILY.    amLODipine (NORVASC) 5 MG tablet TAKE ONE TAB PO QD    rosuvastatin (CRESTOR) 5 MG tablet Take 1 tablet (5 mg total) by mouth daily.    No facility-administered encounter medications on file as of 10/09/2020.     Surgical History: Past Surgical History:  Procedure Laterality Date   TUBAL LIGATION      Medical History: Past Medical History:  Diagnosis Date   Constipation    Hypertension    Pelvic pain in female    PMB (postmenopausal bleeding)    Postmenopausal bleeding 02/11/2015   Sleep apnea     Family History: Family History  Problem Relation Age of Onset   Ovarian cancer Sister    Colon cancer Brother    Asthma Maternal Aunt    Diabetes Maternal Aunt    Kidney disease Maternal Aunt    Heart disease Neg Hx     Social History: Social History   Socioeconomic History   Marital status: Married    Spouse name: Not on file   Number of children: Not on file   Years of education: Not on file   Highest education level: Not on file  Occupational History   Not on file  Tobacco Use   Smoking status: Never   Smokeless tobacco: Never  Substance and Sexual Activity   Alcohol use: Yes    Comment: occas   Drug use: No   Sexual activity: Yes    Birth control/protection: Surgical  Other Topics Concern   Not on file  Social History Narrative   Not on file   Social Determinants of Health   Financial Resource Strain: Not on file  Food Insecurity: Not on file  Transportation Needs: Not on file  Physical Activity: Not on file  Stress: Not on file  Social Connections: Not on file      Review of Systems  Constitutional:  Negative for activity change, appetite change, chills, diaphoresis, fatigue, fever and unexpected weight change.  HENT:  Negative for congestion, ear discharge, ear pain, facial swelling, hearing loss, nosebleeds, postnasal drip, rhinorrhea, sinus pressure, sinus pain, sneezing, sore throat, tinnitus, trouble swallowing and voice change.   Eyes:  Negative for photophobia, pain, discharge, redness, itching and visual disturbance.  Respiratory:  Negative for apnea, cough, choking, chest tightness, shortness of breath, wheezing and stridor.   Cardiovascular:   Negative for chest pain, palpitations and leg swelling.  Gastrointestinal:  Negative for abdominal distention, abdominal pain, anal bleeding, constipation, diarrhea, nausea and rectal pain.  Endocrine: Negative for cold intolerance, heat intolerance, polydipsia, polyphagia and polyuria.  Genitourinary:  Negative for difficulty urinating, flank pain, frequency, genital sores, hematuria, menstrual problem, pelvic pain, urgency, vaginal bleeding, vaginal discharge and vaginal pain.  Musculoskeletal:  Negative for arthralgias, back pain, gait problem, joint swelling, myalgias and neck pain.  Skin:  Negative for color change, pallor, rash and wound.  Allergic/Immunologic: Negative for environmental allergies, food allergies and immunocompromised state.  Neurological:  Negative for dizziness, seizures, syncope, facial asymmetry, speech difficulty, weakness, light-headedness, numbness and headaches.  Hematological:  Negative for adenopathy. Does not bruise/bleed easily.  Psychiatric/Behavioral:  Negative for agitation, behavioral problems, confusion, decreased concentration, dysphoric mood, hallucinations, self-injury, sleep disturbance and suicidal ideas. The patient is not nervous/anxious and is not hyperactive.     Vital Signs: BP 124/80   Pulse 61   Temp 97.8 F (36.6 C)   Resp 16   Ht 4\' 11"  (1.499 m)   Wt 146 lb (66.2 kg)   LMP 01/09/2015 (Approximate)   SpO2 98%   BMI 29.49 kg/m    Physical Exam Constitutional:      General: She is not in acute distress.    Appearance: She is well-developed. She is not diaphoretic.  HENT:     Head: Normocephalic and atraumatic.     Mouth/Throat:     Pharynx: No oropharyngeal exudate.  Eyes:     Pupils: Pupils are equal, round, and reactive to light.  Neck:     Thyroid: No thyromegaly.     Vascular: No JVD.     Trachea: No tracheal deviation.  Cardiovascular:     Rate and Rhythm: Normal rate and regular rhythm.     Heart sounds: Normal heart  sounds. No murmur heard.   No friction rub. No gallop.  Pulmonary:     Effort: Pulmonary effort is normal. No respiratory distress.     Breath sounds: No wheezing or rales.  Chest:     Chest wall: No tenderness.  Abdominal:     General: Bowel sounds are normal.     Palpations: Abdomen is soft.  Musculoskeletal:        General: Normal range of motion.     Cervical back: Normal range of motion and neck supple.  Lymphadenopathy:     Cervical: No cervical adenopathy.  Skin:    General: Skin is warm and dry.  Neurological:     Mental Status: She is alert and oriented to person, place, and time.     Cranial Nerves: No cranial nerve deficit.  Psychiatric:        Behavior: Behavior normal.        Thought Content: Thought content normal.        Judgment:  Judgment normal.     Assessment/Plan: 1. Encounter for general adult medical examination with abnormal findings On preventive health maintenance is updated will get colonoscopy report, patient had colonoscopy 2015 she was instructed to have another colonoscopy in 5 years.Will communicate with pt  - CBC with Differential/Platelet - Lipid Panel With LDL/HDL Ratio - TSH - T4, free - Comprehensive metabolic panel  2. Essential hypertension Will decrease Norvasc 5 mg once a day since her blood pressure is under good control add triamterene hydrochlorothiazide for lower extremity edema - amLODipine (NORVASC) 5 MG tablet; TAKE ONE TAB PO QD  Dispense: 90 tablet; Refill: 3  3. Mixed hyperlipidemia Continue therapy  - rosuvastatin (CRESTOR) 5 MG tablet; Take 1 tablet (5 mg total) by mouth daily.  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 OSA on CPAP Good compliance with CPAP   6. Dysuria - UA/M w/rflx Culture, Routine - Microscopic Examination - Urine Culture, Reflex   7. Encounter for screening colonoscopy Last Colonoscopy in 2015, it was advised to repeat in 5 years.will  schedule one  - Ambulatory referral to Gastroenterology  General Counseling: Lily Peer understanding of the findings of todays visit and agrees with plan of treatment. I have discussed any further diagnostic evaluation that may be needed or ordered today. We also reviewed her medications today. she has been encouraged to call the office with any questions or concerns that should arise related to todays visit.    Counseling:  [place] Controlled Substance Database was reviewed by me.  Orders Placed This Encounter  Procedures   Microscopic Examination   Urine Culture, Reflex   DG Bone Density   UA/M w/rflx Culture, Routine   CBC with Differential/Platelet   Lipid Panel With LDL/HDL Ratio   TSH   T4, free   Comprehensive metabolic panel    Meds ordered this encounter  Medications   amLODipine (NORVASC) 5 MG tablet    Sig: TAKE ONE TAB PO QD    Dispense:  90 tablet    Refill:  3   triamterene-hydrochlorothiazide (MAXZIDE-25) 37.5-25 MG tablet    Sig: Take 1 tablet by mouth daily.    Dispense:  90 tablet    Refill:  3   rosuvastatin (CRESTOR) 5 MG tablet    Sig: Take 1 tablet (5 mg total) by mouth daily.    Dispense:  90 tablet    Refill:  1    Total time spent:35 Minutes  Time spent includes review of chart, medications, test results, and follow up plan with the patient.     Lyndon Code, MD  Internal Medicine

## 2020-10-09 NOTE — Telephone Encounter (Signed)
Request for colonoscopy results faxed to [facility] @ [phone]

## 2020-10-09 NOTE — Telephone Encounter (Signed)
Notified patient of 10/21/20 bone density appointment @ 9:00 am.-Toni

## 2020-10-10 ENCOUNTER — Telehealth: Payer: Self-pay

## 2020-10-10 NOTE — Telephone Encounter (Signed)
Left vm for patient to return my call regarding last colonoscopy-Toni

## 2020-10-13 LAB — MICROSCOPIC EXAMINATION
Bacteria, UA: NONE SEEN
Casts: NONE SEEN /lpf
Epithelial Cells (non renal): NONE SEEN /hpf (ref 0–10)

## 2020-10-13 LAB — UA/M W/RFLX CULTURE, ROUTINE
Bilirubin, UA: NEGATIVE
Glucose, UA: NEGATIVE
Ketones, UA: NEGATIVE
Nitrite, UA: NEGATIVE
Protein,UA: NEGATIVE
RBC, UA: NEGATIVE
Specific Gravity, UA: 1.017 (ref 1.005–1.030)
Urobilinogen, Ur: 0.2 mg/dL (ref 0.2–1.0)
pH, UA: 5.5 (ref 5.0–7.5)

## 2020-10-13 LAB — URINE CULTURE, REFLEX

## 2020-10-15 ENCOUNTER — Telehealth: Payer: Self-pay

## 2020-10-15 NOTE — Telephone Encounter (Signed)
Lmom to call us back regarding pt need to repeat colonoscopy we already put order in epic

## 2020-10-15 NOTE — Addendum Note (Signed)
Addended by: Lyndon Code on: 10/15/2020 02:27 PM   Modules accepted: Orders

## 2020-10-17 ENCOUNTER — Other Ambulatory Visit: Payer: Self-pay | Admitting: Internal Medicine

## 2020-10-17 DIAGNOSIS — I10 Essential (primary) hypertension: Secondary | ICD-10-CM

## 2020-10-17 NOTE — Telephone Encounter (Signed)
We already esend pres last week new directions 1 tab po daily please fill new pres send by dr Beverely Risen

## 2020-10-20 NOTE — Telephone Encounter (Signed)
Patient brought colonoscopy report to office. Sent to be scanned.

## 2020-10-20 NOTE — Telephone Encounter (Signed)
Spoke with patient regarding previous colonoscopy. She will bring report to office today.

## 2020-10-21 ENCOUNTER — Other Ambulatory Visit: Payer: Self-pay

## 2020-10-21 ENCOUNTER — Ambulatory Visit
Admission: RE | Admit: 2020-10-21 | Discharge: 2020-10-21 | Disposition: A | Discharge status: Home / Self Care | Payer: BC Managed Care – PPO | Source: Ambulatory Visit | Attending: Internal Medicine | Admitting: Internal Medicine

## 2020-10-21 DIAGNOSIS — E2839 Other primary ovarian failure: Secondary | ICD-10-CM | POA: Diagnosis not present

## 2020-10-21 DIAGNOSIS — Z780 Asymptomatic menopausal state: Secondary | ICD-10-CM | POA: Diagnosis not present

## 2020-10-21 DIAGNOSIS — M8589 Other specified disorders of bone density and structure, multiple sites: Secondary | ICD-10-CM | POA: Diagnosis not present

## 2020-10-22 ENCOUNTER — Ambulatory Visit: Payer: BC Managed Care – PPO

## 2020-10-28 LAB — COLOGUARD

## 2020-10-29 ENCOUNTER — Ambulatory Visit: Payer: BC Managed Care – PPO

## 2020-10-29 ENCOUNTER — Other Ambulatory Visit: Payer: Self-pay

## 2020-10-29 DIAGNOSIS — G4733 Obstructive sleep apnea (adult) (pediatric): Secondary | ICD-10-CM

## 2020-10-29 NOTE — Progress Notes (Signed)
95 percentile pressure 8   95th percentile leak 12.5   apnea index 0.2 /hr  apnea-hypopnea index  0.8 /hr   total days used  >4 hr 74 days  total days used <4 hr 8 days  Total compliance 82 percent    She is doing good no problems or questions at this time  Pt was seen by Tresa Endo from [facility]

## 2020-10-31 ENCOUNTER — Encounter: Payer: Self-pay | Admitting: Internal Medicine

## 2020-11-17 ENCOUNTER — Ambulatory Visit: Payer: BC Managed Care – PPO | Admitting: Physician Assistant

## 2020-11-20 ENCOUNTER — Other Ambulatory Visit: Payer: Self-pay

## 2020-11-20 ENCOUNTER — Ambulatory Visit (INDEPENDENT_AMBULATORY_CARE_PROVIDER_SITE_OTHER): Payer: BC Managed Care – PPO | Admitting: Physician Assistant

## 2020-11-20 ENCOUNTER — Encounter: Payer: Self-pay | Admitting: Physician Assistant

## 2020-11-20 VITALS — BP 110/70 | HR 79 | Temp 97.8°F | Resp 16 | Ht 59.0 in | Wt 143.0 lb

## 2020-11-20 DIAGNOSIS — Z7189 Other specified counseling: Secondary | ICD-10-CM

## 2020-11-20 DIAGNOSIS — G4733 Obstructive sleep apnea (adult) (pediatric): Secondary | ICD-10-CM

## 2020-11-20 DIAGNOSIS — I10 Essential (primary) hypertension: Secondary | ICD-10-CM | POA: Diagnosis not present

## 2020-11-20 NOTE — Progress Notes (Signed)
[facility] [address] [place], [place] [address]  Pulmonary Sleep Medicine   Office Visit Note  Patient Name: Barbara Cantrell DOB: 03/24/1965 MRN [id number]  Date of Service: 11/23/2020  Complaints/HPI: Pt is here for routine pulmonary follow up for CPAP. She is doing well on the machine and c;eaning it regularly. She feels rested and wears nightly with the exception of recent days missed due to her brother passing away. She denies any daytime sleepiness, dry mouth/nose, or morning headaches. She has no complaints today. Download reviewed and AHI of 0.8 shows well controlled and at 82% compliance.  ROS  General: (-) fever, (-) chills, (-) night sweats, (-) weakness Skin: (-) rashes, (-) itching,. Eyes: (-) visual changes, (-) redness, (-) itching. Nose and Sinuses: (-) nasal stuffiness or itchiness, (-) postnasal drip, (-) nosebleeds, (-) sinus trouble. Mouth and Throat: (-) sore throat, (-) hoarseness. Neck: (-) swollen glands, (-) enlarged thyroid, (-) neck pain. Respiratory: - cough, (-) bloody sputum, - shortness of breath, - wheezing. Cardiovascular: - ankle swelling, (-) chest pain. Lymphatic: (-) lymph node enlargement. Neurologic: (-) numbness, (-) tingling. Psychiatric: (-) anxiety, (-) depression   Current Medication: Outpatient Encounter Medications as of 11/20/2020  Medication Sig Note   amLODipine (NORVASC) 5 MG tablet TAKE ONE TAB PO QD    cyclobenzaprine (FLEXERIL) 5 MG tablet Take 1 tablet (5 mg total) by mouth 2 (two) times daily as needed for muscle spasms.    ergocalciferol (DRISDOL) 1.25 MG (50000 UT) capsule Take 1 capsule (50,000 Units total) by mouth once a week.    fluticasone (FLONASE) 50 MCG/ACT nasal spray Place 1 spray into both nostrils.    ibuprofen (ADVIL,MOTRIN) 600 MG tablet TAKE 1 TABLET(S) BY MOUTH THREE TIMES A DAY AS NEEDED FOR PAIN 02/11/2015: Received from: External Pharmacy   rosuvastatin (CRESTOR) 5 MG tablet Take 1 tablet  (5 mg total) by mouth daily.    triamterene-hydrochlorothiazide (MAXZIDE-25) 37.5-25 MG tablet Take 1 tablet by mouth daily.    No facility-administered encounter medications on file as of 11/20/2020.    Surgical History: Past Surgical History:  Procedure Laterality Date   TUBAL LIGATION      Medical History: Past Medical History:  Diagnosis Date   Constipation    Hypertension    Pelvic pain in female    PMB (postmenopausal bleeding)    Postmenopausal bleeding 02/11/2015   Sleep apnea     Family History: Family History  Problem Relation Age of Onset   Ovarian cancer Sister    Colon cancer Brother    Asthma Maternal Aunt    Diabetes Maternal Aunt    Kidney disease Maternal Aunt    Heart disease Neg Hx     Social History: Social History   Socioeconomic History   Marital status: Married    Spouse name: Not on file   Number of children: Not on file   Years of education: Not on file   Highest education level: Not on file  Occupational History   Not on file  Tobacco Use   Smoking status: Never   Smokeless tobacco: Never  Substance and Sexual Activity   Alcohol use: Yes    Comment: occas   Drug use: No   Sexual activity: Yes    Birth control/protection: Surgical  Other Topics Concern   Not on file  Social History Narrative   Not on file   Social Determinants of Health   Financial Resource Strain: Not on file  Food Insecurity:  Not on file  Transportation Needs: Not on file  Physical Activity: Not on file  Stress: Not on file  Social Connections: Not on file  Intimate Partner Violence: Not on file    Vital Signs: Blood pressure 110/70, pulse 79, temperature 97.8 F (36.6 C), resp. rate 16, height 4\' 11"  (1.499 m), weight 143 lb (64.9 kg), last menstrual period 01/09/2015, SpO2 98 %.  Examination: General Appearance: The patient is well-developed, well-nourished, and in no distress. Skin: Gross inspection of skin unremarkable. Head: normocephalic, no  gross deformities. Eyes: no gross deformities noted. ENT: ears appear grossly normal no exudates. Neck: Supple. No thyromegaly. No LAD. Respiratory: Lungs clear to auscultation bilaterally. Cardiovascular: Normal S1 and S2 without murmur or rub. Extremities: No cyanosis. pulses are equal. Neurologic: Alert and oriented. No involuntary movements.  LABS: Recent Results (from the past 2160 hour(s))  UA/M w/rflx Culture, Routine     Status: Abnormal   Collection Time: 10/09/20  4:17 PM   Specimen: Urine   Urine  Result Value Ref Range   Specific Gravity, UA 1.017 1.005 - 1.030   pH, UA 5.5 5.0 - 7.5   Color, UA Yellow Yellow   Appearance Ur Clear Clear   Leukocytes,UA Trace (A) Negative   Protein,UA Negative Negative/Trace   Glucose, UA Negative Negative   Ketones, UA Negative Negative   RBC, UA Negative Negative   Bilirubin, UA Negative Negative   Urobilinogen, Ur 0.2 0.2 - 1.0 mg/dL   Nitrite, UA Negative Negative   Microscopic Examination See below:     Comment: Microscopic was indicated and was performed.   Urinalysis Reflex Comment     Comment: This specimen has reflexed to a Urine Culture.  Microscopic Examination     Status: None   Collection Time: 10/09/20  4:17 PM   Urine  Result Value Ref Range   WBC, UA 0-5 0 - 5 /hpf   RBC 0-2 0 - 2 /hpf   Epithelial Cells (non renal) None seen 0 - 10 /hpf   Casts None seen None seen /lpf   Bacteria, UA None seen None seen/Few  Urine Culture, Reflex     Status: None   Collection Time: 10/09/20  4:17 PM   Urine  Result Value Ref Range   Urine Culture, Routine Final report    Organism ID, Bacteria Comment     Comment: Mixed urogenital flora Less than 10,000 colonies/mL     Radiology: DG Bone Density  Result Date: 10/21/2020 EXAM: DUAL X-RAY ABSORPTIOMETRY (DXA) FOR BONE MINERAL DENSITY IMPRESSION: Dear Dr. 10/23/2020, Your patient [name] completed a FRAX assessment on 10/21/2020 using the Lunar iDXA DXA System (analysis  version: 14.10) manufactured by 10/23/2020. The following summarizes the results of our evaluation. PATIENT BIOGRAPHICAL: Name: Barbara, Cantrell Patient ID: Marti Sleigh Birth Date: 05/03/1965 Height:    58.5 in. Gender:     Female    [age]       Weight:    144.9 lbs. Ethnicity:  Hispanic                         Exam Date: 10/21/2020 FRAX* RESULTS:  (version: 3.5) 10-year Probability of Fracture1 Major Osteoporotic Fracture2 Hip Fracture 4.3% 0.5% Population: 12-13-1976 (Hispanic) Risk Factors: None Based on Femur (Left) Neck BMD 1 -The 10-year probability of fracture may be lower than reported if the patient has received treatment. 2 -Major Osteoporotic Fracture:  Clinical Spine, Forearm, Hip or Shoulder *FRAX is a Armed forces logistics/support/administrative officer of the [employer] of [employer] for Metabolic Bone Disease, a World Science writer (WHO) [employer]. ASSESSMENT: The probability of a major osteoporotic fracture is 4.3% within the next ten years. The probability of a hip fracture is 0.5% within the next ten years. . Your patient Hannia Matchett completed a BMD test on 10/21/2020 using the [employer] DXA System (software version: 14.10) manufactured by [employer]. The following summarizes the results of our evaluation. Technologist: [facility] PATIENT BIOGRAPHICAL: Name: Tama, Grosz Patient ID: [id number] Birth Date: 1965-05-13 Height: 58.5 in. Gender: Female Exam Date: 10/21/2020 Weight: 144.9 lbs. Indications: Postmenopausal Fractures: Treatments: Vitamin D DENSITOMETRY RESULTS: Site      Region    Measured Date Measured Age WHO Classification Young Adult T-score BMD         %Change vs. Previous Significant Change (*) AP Spine L1-L4 10/21/2020 56.3 Osteopenia -2.3 0.913 g/cm2 DualFemur Neck Left 10/21/2020 56.3 Osteopenia -1.9 0.772 g/cm2 ASSESSMENT: The BMD measured at AP Spine L1-L4 is 0.913 g/cm2 with a T-score of -2.3. This patient is considered osteopenic according to World Health  Organization [facility]) criteria. The scan quality is good. World Science writer [facility]) criteria for post-menopausal, Caucasian Women: Normal:                   T-score at or above -1 SD Osteopenia/low bone mass: T-score between -1 and -2.5 SD Osteoporosis:             T-score at or below -2.5 SD RECOMMENDATIONS: 1. All patients should optimize calcium and vitamin D intake. 2. Consider FDA-approved medical therapies in postmenopausal women and men aged 39 years and older, based on the following: a. A hip or vertebral(clinical or morphometric) fracture b. T-score < -2.5 at the femoral neck or spine after appropriate evaluation to exclude secondary causes c. Low bone mass (T-score between -1.0 and -2.5 at the femoral neck or spine) and a 10-year probability of a hip fracture > 3% or a 10-year probability of a major osteoporosis-related fracture > 20% based on the [place]-adapted WHO algorithm 3. Clinician judgment and/or patient preferences may indicate treatment for people with 10-year fracture probabilities above or below these levels FOLLOW-UP: People with diagnosed cases of osteoporosis or at high risk for fracture should have regular bone mineral density tests. For patients eligible for Medicare, routine testing is allowed once every 2 years. The testing frequency can be increased to one year for patients who have rapidly progressing disease, those who are receiving or discontinuing medical therapy to restore bone mass, or have additional risk factors. I have reviewed this report, and agree with the above findings. [facility] Electronically Signed   By: Charlett Nose M.D.   On: 10/21/2020 11:56    No results found.  No results found.    Assessment and Plan: Patient Active Problem List   Diagnosis Date Noted   Muscle cramps 11/17/2019   Urinary tract infection without hematuria 11/17/2019   Encounter for general adult medical examination with abnormal findings 10/17/2019   Dysuria  10/17/2019   OSA (obstructive sleep apnea) 03/29/2019   Acute right-sided low back pain with right-sided sciatica 11/14/2018   Loud snoring 11/14/2018   Excessive daytime sleepiness 11/14/2018   Screening for colon cancer 11/14/2018   Essential hypertension 03/28/2017   Mixed hyperlipidemia 03/28/2017   Vitamin D deficiency 03/28/2017   Routine cervical smear 03/28/2017   Postmenopausal bleeding  02/11/2015   Family history of breast cancer 02/11/2015   Family history of ovarian cancer 02/11/2015   Family history of colon cancer 02/11/2015    1. Obstructive sleep apnea Continue nightly use  2. CPAP use counseling CPAP couseling-Discussed importance of adequate CPAP use as well as proper care and cleaning techniques of machine and all supplies.  3. Essential hypertension Well controlled, continue current medication, followed by PCP   General Counseling: I have discussed the findings of the evaluation and examination with Harriett Sine.  I have also discussed any further diagnostic evaluation thatmay be needed or ordered today. Ervin verbalizes understanding of the findings of todays visit. We also reviewed her medications today and discussed drug interactions and side effects including but not limited excessive drowsiness and altered mental states. We also discussed that there is always a risk not just to her but also people around her. she has been encouraged to call the office with any questions or concerns that should arise related to todays visit.  No orders of the defined types were placed in this encounter.    Time spent: 47  I have personally obtained a history, examined the patient, evaluated laboratory and imaging results, formulated the assessment and plan and placed orders. This patient was seen by Lynn Ito, PA-C in collaboration with Dr. Freda Munro as a part of collaborative care agreement.     Yevonne Pax, MD [facility] Pulmonary and Critical Care Sleep medicine

## 2020-11-23 NOTE — Patient Instructions (Signed)

## 2020-12-23 DIAGNOSIS — G4733 Obstructive sleep apnea (adult) (pediatric): Secondary | ICD-10-CM | POA: Diagnosis not present

## 2020-12-27 DIAGNOSIS — J0100 Acute maxillary sinusitis, unspecified: Secondary | ICD-10-CM | POA: Diagnosis not present

## 2021-01-12 ENCOUNTER — Other Ambulatory Visit: Payer: Self-pay

## 2021-01-12 ENCOUNTER — Encounter: Payer: Self-pay | Admitting: Physician Assistant

## 2021-01-12 ENCOUNTER — Ambulatory Visit: Payer: BC Managed Care – PPO | Admitting: Physician Assistant

## 2021-01-12 DIAGNOSIS — E559 Vitamin D deficiency, unspecified: Secondary | ICD-10-CM

## 2021-01-12 DIAGNOSIS — M8589 Other specified disorders of bone density and structure, multiple sites: Secondary | ICD-10-CM | POA: Diagnosis not present

## 2021-01-12 DIAGNOSIS — E782 Mixed hyperlipidemia: Secondary | ICD-10-CM

## 2021-01-12 DIAGNOSIS — E538 Deficiency of other specified B group vitamins: Secondary | ICD-10-CM | POA: Diagnosis not present

## 2021-01-12 DIAGNOSIS — R5383 Other fatigue: Secondary | ICD-10-CM

## 2021-01-12 DIAGNOSIS — I10 Essential (primary) hypertension: Secondary | ICD-10-CM

## 2021-01-12 NOTE — Progress Notes (Signed)
Barbara Cantrell Barbara Cantrell, Barbara Cantrell [id number]  Internal MEDICINE  Office Visit Note  Patient Name: Barbara Cantrell  [name]  [name]  Date of Service: 01/12/2021  Chief Complaint  Patient presents with   Follow-up     BMD   Hypertension    HPI Pt is here for routine health maintenance -Reviewed BMD showing osteopenia and discussed supplementing calcium and vitamin D. Will also update labs and add to labs ordered at CPE last visit, but pt forgot to get done. Will go now.  -eye exam needs to be scheduled -sleeping well -did get sick about a month ago and ended up needing ABX. Was tested for strep, flu, covid and all negative. She is feeling better now. -BP stable  Current Medication: Outpatient Encounter Medications as of 01/12/2021  Medication Sig Note   amLODipine (NORVASC) 5 MG tablet TAKE ONE TAB PO QD    cyclobenzaprine (FLEXERIL) 5 MG tablet Take 1 tablet (5 mg total) by mouth 2 (two) times daily as needed for muscle spasms.    ergocalciferol (DRISDOL) 1.25 MG (50000 UT) capsule Take 1 capsule (50,000 Units total) by mouth once a week.    fluticasone (FLONASE) 50 MCG/ACT nasal spray Place 1 spray into both nostrils.    ibuprofen (ADVIL,MOTRIN) 600 MG tablet TAKE 1 TABLET(S) BY MOUTH THREE TIMES A DAY AS NEEDED FOR PAIN 02/11/2015: Received from: External Pharmacy   rosuvastatin (CRESTOR) 5 MG tablet Take 1 tablet (5 mg total) by mouth daily.    triamterene-hydrochlorothiazide (MAXZIDE-25) 37.5-25 MG tablet Take 1 tablet by mouth daily.    No facility-administered encounter medications on file as of 01/12/2021.    Surgical History: Past Surgical History:  Procedure Laterality Date   TUBAL LIGATION      Medical History: Past Medical History:  Diagnosis Date   Constipation    Hypertension    Pelvic pain in female    PMB (postmenopausal bleeding)    Postmenopausal bleeding 02/11/2015   Sleep apnea     Family History: Family History  Problem  Relation Age of Onset   Ovarian cancer Sister    Colon cancer Brother    Asthma Maternal Aunt    Diabetes Maternal Aunt    Kidney disease Maternal Aunt    Heart disease Neg Hx     Social History   Socioeconomic History   Marital status: Married    Spouse name: Not on file   Number of children: Not on file   Years of education: Not on file   Highest education level: Not on file  Occupational History   Not on file  Tobacco Use   Smoking status: Never   Smokeless tobacco: Never  Substance and Sexual Activity   Alcohol use: Yes    Comment: occas   Drug use: No   Sexual activity: Yes    Birth control/protection: Surgical  Other Topics Concern   Not on file  Social History Narrative   Not on file   Social Determinants of Health   Financial Resource Strain: Not on file  Food Insecurity: Not on file  Transportation Needs: Not on file  Physical Activity: Not on file  Stress: Not on file  Social Connections: Not on file  Intimate Partner Violence: Not on file      Review of Systems  Constitutional:  Negative for chills, diaphoresis and fatigue.  HENT:  Negative for ear pain, postnasal drip and sinus pressure.   Eyes:  Negative for photophobia, discharge,  redness, itching and visual disturbance.  Respiratory:  Negative for cough, shortness of breath and wheezing.   Cardiovascular:  Negative for chest pain, palpitations and leg swelling.  Gastrointestinal:  Negative for abdominal pain, constipation, diarrhea, nausea and vomiting.  Genitourinary:  Negative for dysuria and flank pain.  Musculoskeletal:  Negative for arthralgias, back pain, gait problem and neck pain.  Skin:  Negative for color change.  Allergic/Immunologic: Negative for environmental allergies and food allergies.  Neurological:  Negative for dizziness and headaches.  Hematological:  Does not bruise/bleed easily.  Psychiatric/Behavioral:  Negative for agitation, behavioral problems (depression) and  hallucinations.    Vital Signs: BP 140/80   Pulse 75   Temp 97.8 F (36.6 C)   Resp 16   Ht 4\' 11"  (1.499 m)   Wt 142 lb (64.4 kg)   LMP 01/09/2015 (Approximate)   SpO2 99%   BMI 28.68 kg/m    Physical Exam Vitals and nursing note reviewed.  Constitutional:      General: She is not in acute distress.    Appearance: She is well-developed. She is not diaphoretic.  HENT:     Head: Normocephalic and atraumatic.     Mouth/Throat:     Pharynx: No oropharyngeal exudate.  Eyes:     Pupils: Pupils are equal, round, and reactive to light.  Neck:     Thyroid: No thyromegaly.     Vascular: No JVD.     Trachea: No tracheal deviation.  Cardiovascular:     Rate and Rhythm: Normal rate and regular rhythm.     Heart sounds: Normal heart sounds. No murmur heard.   No friction rub. No gallop.  Pulmonary:     Effort: Pulmonary effort is normal. No respiratory distress.     Breath sounds: No wheezing or rales.  Chest:     Chest wall: No tenderness.  Abdominal:     General: Bowel sounds are normal.     Palpations: Abdomen is soft.  Musculoskeletal:        General: Normal range of motion.     Cervical back: Normal range of motion and neck supple.  Lymphadenopathy:     Cervical: No cervical adenopathy.  Skin:    General: Skin is warm and dry.  Neurological:     Mental Status: She is alert and oriented to person, place, and time.     Cranial Nerves: No cranial nerve deficit.  Psychiatric:        Behavior: Behavior normal.        Thought Content: Thought content normal.        Judgment: Judgment normal.       Assessment/Plan: 1. Essential hypertension Stable, continue current medications  2. Osteopenia of multiple sites Will supplement calcium and vit D and continue to monitor  3. Vitamin D deficiency - VITAMIN D 25 Hydroxy (Vit-D Deficiency, Fractures)  4. B12 deficiency - B12 and Folate Panel  5. Mixed hyperlipidemia - Lipid Panel With LDL/HDL Ratio  6. Other  fatigue - TSH + free T4 - CBC w/Diff/Platelet - Comprehensive metabolic panel   General Counseling: 14/02/2014 verbalizes understanding of the findings of todays visit and agrees with plan of treatment. I have discussed any further diagnostic evaluation that may be needed or ordered today. We also reviewed her medications today. she has been encouraged to call the office with any questions or concerns that should arise related to todays visit.    Orders Placed This Encounter  Procedures   TSH + free  T4   CBC w/Diff/Platelet   Comprehensive metabolic panel   VITAMIN D 25 Hydroxy (Vit-D Deficiency, Fractures)   Lipid Panel With LDL/HDL Ratio   B12 and Folate Panel    No orders of the defined types were placed in this encounter.   This patient was seen by Drema Dallas, PA-C in collaboration with Dr. Clayborn Bigness as a part of collaborative care agreement.   Total time spent:30 Minutes Time spent includes review of chart, medications, test results, and follow up plan with the patient.      Dr Lavera Guise Internal medicine

## 2021-01-12 NOTE — Progress Notes (Deleted)
  [facility] [address] [place], [place] [address]  Internal MEDICINE  Office Visit Note  Patient Name: Barbara Cantrell  [id number]  [id number]  Date of Service: 01/12/2021  Chief Complaint  Patient presents with   Follow-up     BMD   Hypertension     HPI Pt is here for routine health maintenance examination -Reviewed BMD -eye exam needs to be scheduled -sleeping well -did get sick about a month ago and ended up needing ABX. Was tested for strep, flu, covid and all negative.  Current Medication: Outpatient Encounter Medications as of 01/12/2021  Medication Sig Note   amLODipine (NORVASC) 5 MG tablet TAKE ONE TAB PO QD    cyclobenzaprine (FLEXERIL) 5 MG tablet Take 1 tablet (5 mg total) by mouth 2 (two) times daily as needed for muscle spasms.    ergocalciferol (DRISDOL) 1.25 MG (50000 UT) capsule Take 1 capsule (50,000 Units total) by mouth once a week.    fluticasone (FLONASE) 50 MCG/ACT nasal spray Place 1 spray into both nostrils.    ibuprofen (ADVIL,MOTRIN) 600 MG tablet TAKE 1 TABLET(S) BY MOUTH THREE TIMES A DAY AS NEEDED FOR PAIN 02/11/2015: Received from: External Pharmacy   rosuvastatin (CRESTOR) 5 MG tablet Take 1 tablet (5 mg total) by mouth daily.    triamterene-hydrochlorothiazide (MAXZIDE-25) 37.5-25 MG tablet Take 1 tablet by mouth daily.    No facility-administered encounter medications on file as of 01/12/2021.    Surgical History: Past Surgical History:  Procedure Laterality Date   TUBAL LIGATION      Medical History: Past Medical History:  Diagnosis Date   Constipation    Hypertension    Pelvic pain in female    PMB (postmenopausal bleeding)    Postmenopausal bleeding 02/11/2015   Sleep apnea     Family History: Family History  Problem Relation Age of Onset   Ovarian cancer Sister    Colon cancer Brother    Asthma Maternal Aunt    Diabetes Maternal Aunt    Kidney disease Maternal Aunt    Heart disease Neg Hx       Review  of Systems   Vital Signs: BP 140/80   Pulse 75   Temp 97.8 F (36.6 C)   Resp 16   Ht 4\' 11"  (1.499 m)   Wt 142 lb (64.4 kg)   LMP 01/09/2015 (Approximate)   SpO2 99%   BMI 28.68 kg/m    Physical Exam   LABS: No results found for this or any previous visit (from the past 2160 hour(s)).  [id number]    Assessment/Plan:   General Counseling: Marland Kitchen understanding of the findings of todays visit and agrees with plan of treatment. I have discussed any further diagnostic evaluation that may be needed or ordered today. We also reviewed her medications today. she has been encouraged to call the office with any questions or concerns that should arise related to todays visit.    Counseling:    No orders of the defined types were placed in this encounter.   No orders of the defined types were placed in this encounter.   This patient was seen by Lily Peer, PA-C in collaboration with Dr. Lynn Ito as a part of collaborative care agreement.  Total time spent:*** Minutes  Time spent includes review of chart, medications, test results, and follow up plan with the patient.     Beverely Risen, MD  Internal Medicine

## 2021-01-12 NOTE — Patient Instructions (Signed)
Osteopenia ?Osteopenia is a loss of thickness (density) inside the bones. Another name for osteopenia is low bone mass. Mild osteopenia is a normal part of aging. It is not a disease, and it does not cause symptoms. ?However, if you have osteopenia and continue to lose bone mass, you could develop a condition that causes the bones to become thin and break more easily (osteoporosis). Osteoporosis can cause you to lose some height, have back pain, and have a stooped posture. Although osteopenia is not a disease, making changes to your lifestyle and diet can help to prevent osteopenia from developing into osteoporosis. ?What are the causes? ?Osteopenia is caused by loss of calcium in the bones. Bones are constantly changing. Old bone cells are continually being replaced with new bone cells. This process builds new bone. ?The mineral calcium is needed to build new bone and maintain bone density. Bone density is usually highest around age 35. After that, most people's bodies cannot replace all the bone they have lost with new bone. ?What increases the risk? ?You are more likely to develop this condition if: ?You are older than age 50. ?You are a woman who went through menopause early. ?You have a long illness that keeps you in bed. ?You do not get enough exercise. ?You lack certain nutrients (malnutrition). ?You have an overactive thyroid gland (hyperthyroidism). ?You use products that contain nicotine or tobacco, such as cigarettes, e-cigarettes and chewing tobacco, or you drink a lot of alcohol. ?You are taking medicines that weaken the bones, such as steroids. ?What are the signs or symptoms? ?This condition does not cause any symptoms. You may have a slightly higher risk for bone breaks (fractures), so getting fractures more easily than normal may be an indication of osteopenia. ?How is this diagnosed? ?This condition may be diagnosed based on an X-ray exam that measures bone density (dual-energy X-ray  absorptiometry, or DEXA). This test can measure bone density in your hips, spine, and wrists. ?Osteopenia has no symptoms, so this condition is usually diagnosed after a routine bone density screening test is done for osteoporosis. This routine screening is usually done for: ?Women who are age 65 or older. ?Men who are age 70 or older. ?If you have risk factors for osteopenia, you may have the screening test at an earlier age. ?How is this treated? ?Making dietary and lifestyle changes can lower your risk for osteoporosis. ?If you have severe osteopenia that is close to becoming osteoporosis, this condition can be treated with medicines and dietary supplements such as calcium and vitamin D. These supplements help to rebuild bone density. ?Follow these instructions at home: ?Eating and drinking ?Eat a diet that is high in calcium and vitamin D. ?Calcium is found in dairy products, beans, salmon, and leafy green vegetables like spinach and broccoli. ?Look for foods that have vitamin D and calcium added to them (fortified foods), such as orange juice, cereal, and bread. ? ?Lifestyle ?Do 30 minutes or more of a weight-bearing exercise every day, such as walking, jogging, or playing a sport. These types of exercises strengthen the bones. ?Do not use any products that contain nicotine or tobacco, such as cigarettes, e-cigarettes, and chewing tobacco. If you need help quitting, ask your health care provider. ?Do not drink alcohol if: ?Your health care provider tells you not to drink. ?You are pregnant, may be pregnant, or are planning to become pregnant. ?If you drink alcohol: ?Limit how much you use to: ?0-1 drink a day for women. ?0-2 drinks   a day for men. ?Be aware of how much alcohol is in your drink. In the [place] one drink equals one 12 oz bottle of beer (355 mL), one 5 oz glass of wine (148 mL), or one 1? oz glass of hard liquor (44 mL). ?General instructions ?Take over-the-counter and prescription medicines only as  told by your health care provider. These include vitamins and supplements. ?Take precautions at home to lower your risk of falling, such as: ?Keeping rooms well-lit and free of clutter, such as cords. ?Installing safety rails on stairs. ?Using rubber mats in the bathroom or other areas that are often wet or slippery. ?Keep all follow-up visits. This is important. ?Contact a health care provider if: ?You have not had a bone density screening for osteoporosis and you are: ?A woman who is age 65 or older. ?A man who is age 70 or older. ?You are a postmenopausal woman who has not had a bone density screening for osteoporosis. ?You are older than age 50 and you want to know if you should have bone density screening for osteoporosis. ?Summary ?Osteopenia is a loss of thickness (density) inside the bones. Another name for osteopenia is low bone mass. ?Osteopenia is not a disease, but it may increase your risk for a condition that causes the bones to become thin and break more easily (osteoporosis). ?You may be at risk for osteopenia if you are older than age 50 or if you are a woman who went through early menopause. ?Osteopenia does not cause any symptoms, but it can be diagnosed with a bone density screening test. ?Dietary and lifestyle changes are the first treatment for osteopenia. These may lower your risk for osteoporosis. ?This information is not intended to replace advice given to you by your health care provider. Make sure you discuss any questions you have with your health care provider. ?Document Revised: 07/12/2019 Document Reviewed: 07/12/2019 ?Elsevier Patient Education ? 2022 Elsevier Inc. ? ?

## 2021-03-04 DIAGNOSIS — E559 Vitamin D deficiency, unspecified: Secondary | ICD-10-CM | POA: Diagnosis not present

## 2021-03-04 DIAGNOSIS — R5383 Other fatigue: Secondary | ICD-10-CM | POA: Diagnosis not present

## 2021-03-04 DIAGNOSIS — E782 Mixed hyperlipidemia: Secondary | ICD-10-CM | POA: Diagnosis not present

## 2021-03-04 DIAGNOSIS — E538 Deficiency of other specified B group vitamins: Secondary | ICD-10-CM | POA: Diagnosis not present

## 2021-03-05 LAB — CBC WITH DIFFERENTIAL/PLATELET
Basophils Absolute: 0 10*3/uL (ref 0.0–0.2)
Basos: 0 %
EOS (ABSOLUTE): 0.4 10*3/uL (ref 0.0–0.4)
Eos: 5 %
Hematocrit: 38 % (ref 34.0–46.6)
Hemoglobin: 12.9 g/dL (ref 11.1–15.9)
Immature Grans (Abs): 0 10*3/uL (ref 0.0–0.1)
Immature Granulocytes: 0 %
Lymphocytes Absolute: 4.2 10*3/uL — ABNORMAL HIGH (ref 0.7–3.1)
Lymphs: 51 %
MCH: 28.3 pg (ref 26.6–33.0)
MCHC: 33.9 g/dL (ref 31.5–35.7)
MCV: 83 fL (ref 79–97)
Monocytes Absolute: 0.4 10*3/uL (ref 0.1–0.9)
Monocytes: 4 %
Neutrophils Absolute: 3.3 10*3/uL (ref 1.4–7.0)
Neutrophils: 40 %
Platelets: 383 10*3/uL (ref 150–450)
RBC: 4.56 x10E6/uL (ref 3.77–5.28)
RDW: 13.9 % (ref 11.7–15.4)
WBC: 8.3 10*3/uL (ref 3.4–10.8)

## 2021-03-05 LAB — COMPREHENSIVE METABOLIC PANEL
ALT: 16 IU/L (ref 0–32)
AST: 14 IU/L (ref 0–40)
Albumin/Globulin Ratio: 1.3 (ref 1.2–2.2)
Albumin: 4.5 g/dL (ref 3.8–4.9)
Alkaline Phosphatase: 61 IU/L (ref 44–121)
BUN/Creatinine Ratio: 19 (ref 9–23)
BUN: 17 mg/dL (ref 6–24)
Bilirubin Total: 0.4 mg/dL (ref 0.0–1.2)
CO2: 23 mmol/L (ref 20–29)
Calcium: 9.6 mg/dL (ref 8.7–10.2)
Chloride: 103 mmol/L (ref 96–106)
Creatinine, Ser: 0.91 mg/dL (ref 0.57–1.00)
Globulin, Total: 3.4 g/dL (ref 1.5–4.5)
Glucose: 97 mg/dL (ref 70–99)
Potassium: 4.2 mmol/L (ref 3.5–5.2)
Sodium: 143 mmol/L (ref 134–144)
Total Protein: 7.9 g/dL (ref 6.0–8.5)
eGFR: 74 mL/min/{1.73_m2} (ref >59)

## 2021-03-05 LAB — TSH+FREE T4
Free T4: 1.11 ng/dL (ref 0.82–1.77)
TSH: 0.721 u[IU]/mL (ref 0.450–4.500)

## 2021-03-05 LAB — LIPID PANEL WITH LDL/HDL RATIO
Cholesterol, Total: 166 mg/dL (ref 100–199)
HDL: 41 mg/dL (ref >39)
LDL Chol Calc (NIH): 89 mg/dL (ref 0–99)
LDL/HDL Ratio: 2.2 ratio (ref 0.0–3.2)
Triglycerides: 211 mg/dL — ABNORMAL HIGH (ref 0–149)
VLDL Cholesterol Cal: 36 mg/dL (ref 5–40)

## 2021-03-05 LAB — B12 AND FOLATE PANEL
Folate: 10.7 ng/mL (ref >3.0)
Vitamin B-12: 460 pg/mL (ref 232–1245)

## 2021-03-05 LAB — VITAMIN D 25 HYDROXY (VIT D DEFICIENCY, FRACTURES): Vit D, 25-Hydroxy: 32.1 ng/mL (ref 30.0–100.0)

## 2021-03-23 DIAGNOSIS — G4733 Obstructive sleep apnea (adult) (pediatric): Secondary | ICD-10-CM | POA: Diagnosis not present

## 2021-04-08 ENCOUNTER — Other Ambulatory Visit: Payer: Self-pay

## 2021-04-08 ENCOUNTER — Ambulatory Visit: Payer: BC Managed Care – PPO

## 2021-04-08 DIAGNOSIS — G4733 Obstructive sleep apnea (adult) (pediatric): Secondary | ICD-10-CM | POA: Diagnosis not present

## 2021-04-08 NOTE — Progress Notes (Signed)
95 percentile pressure 8 ? ? 95th percentile leak 12.5 ? ? apnea index 0.3 /hr ? apnea-hypopnea index  0.8 /hr ? ? total days used  >4 hr 80 days ? total days used <4 hr 6 days ? ?Total compliance 89 percent ? ?She is doing great no problems or questions at this time ? ?Pt was seen by Tresa Endo  RRT/RCP  from [facility]  ?

## 2021-04-11 DIAGNOSIS — S93402A Sprain of unspecified ligament of left ankle, initial encounter: Secondary | ICD-10-CM | POA: Diagnosis not present

## 2021-04-12 DIAGNOSIS — S93402A Sprain of unspecified ligament of left ankle, initial encounter: Secondary | ICD-10-CM | POA: Diagnosis not present

## 2021-05-11 ENCOUNTER — Encounter: Payer: Self-pay | Admitting: Nurse Practitioner

## 2021-05-11 ENCOUNTER — Ambulatory Visit: Payer: BC Managed Care – PPO | Admitting: Nurse Practitioner

## 2021-05-11 VITALS — BP 128/68 | HR 78 | Temp 98.0°F | Resp 16 | Ht 59.0 in | Wt 141.0 lb

## 2021-05-11 DIAGNOSIS — Z23 Encounter for immunization: Secondary | ICD-10-CM | POA: Diagnosis not present

## 2021-05-11 DIAGNOSIS — I10 Essential (primary) hypertension: Secondary | ICD-10-CM | POA: Diagnosis not present

## 2021-05-11 DIAGNOSIS — M8589 Other specified disorders of bone density and structure, multiple sites: Secondary | ICD-10-CM | POA: Diagnosis not present

## 2021-05-11 DIAGNOSIS — E782 Mixed hyperlipidemia: Secondary | ICD-10-CM | POA: Diagnosis not present

## 2021-05-11 MED ORDER — ZOSTER VAC RECOMB ADJUVANTED 50 MCG/0.5ML IM SUSR: 0.5000 mL | Freq: Once | INTRAMUSCULAR | 0 refills | Status: AC

## 2021-05-11 NOTE — Progress Notes (Signed)
[facility] ?[address] ?[place], [place] [address] ? ?Internal MEDICINE  ?Office Visit Note ? ?Patient Name: Barbara Cantrell ? [id number]  ?[id number] ? ?Date of Service: 05/11/2021 ? ?Chief Complaint  ?Patient presents with  ? Follow-up  ? Hypertension  ? ? ?HPI ?Amantha presents for a follow up visit for hypertension. Her blood pressure is stable and well-controlled with current medications. She does not need any refills and has no other concerns. She will have a pulmonary visit on 05/21/21.  ? ?  ? ? ?Current Medication: ?Outpatient Encounter Medications as of 05/11/2021  ?Medication Sig Note  ? amLODipine (NORVASC) 5 MG tablet TAKE ONE TAB PO QD   ? cyclobenzaprine (FLEXERIL) 5 MG tablet Take 1 tablet (5 mg total) by mouth 2 (two) times daily as needed for muscle spasms.   ? ergocalciferol (DRISDOL) 1.25 MG (50000 UT) capsule Take 1 capsule (50,000 Units total) by mouth once a week.   ? fluticasone (FLONASE) 50 MCG/ACT nasal spray Place 1 spray into both nostrils.   ? ibuprofen (ADVIL,MOTRIN) 600 MG tablet TAKE 1 TABLET(S) BY MOUTH THREE TIMES A DAY AS NEEDED FOR PAIN 02/11/2015: Received from: External Pharmacy  ? rosuvastatin (CRESTOR) 5 MG tablet Take 1 tablet (5 mg total) by mouth daily.   ? triamterene-hydrochlorothiazide (MAXZIDE-25) 37.5-25 MG tablet Take 1 tablet by mouth daily.   ? [DISCONTINUED] Zoster Vaccine Adjuvanted [facility]) injection Inject 0.5 mLs into the muscle once.   ? Zoster Vaccine Adjuvanted [facility]) injection Inject 0.5 mLs into the muscle once for 1 dose.   ? ?No facility-administered encounter medications on file as of 05/11/2021.  ? ? ?Surgical History: ?Past Surgical History:  ?Procedure Laterality Date  ? TUBAL LIGATION    ? ? ?Medical History: ?Past Medical History:  ?Diagnosis Date  ? Constipation   ? Hypertension   ? Pelvic pain in female   ? PMB (postmenopausal bleeding)   ? Postmenopausal bleeding 02/11/2015  ? Sleep apnea   ? ? ?Family History: ?Family History  ?Problem  Relation Age of Onset  ? Ovarian cancer Sister   ? Colon cancer Brother   ? Asthma Maternal Aunt   ? Diabetes Maternal Aunt   ? Kidney disease Maternal Aunt   ? Heart disease Neg Hx   ? ? ?Social History  ? ?Socioeconomic History  ? Marital status: Married  ?  Spouse name: Not on file  ? Number of children: Not on file  ? Years of education: Not on file  ? Highest education level: Not on file  ?Occupational History  ? Not on file  ?Tobacco Use  ? Smoking status: Never  ? Smokeless tobacco: Never  ?Substance and Sexual Activity  ? Alcohol use: Yes  ?  Comment: occas  ? Drug use: No  ? Sexual activity: Yes  ?  Birth control/protection: Surgical  ?Other Topics Concern  ? Not on file  ?Social History Narrative  ? Not on file  ? ?Social Determinants of Health  ? ?Financial Resource Strain: Not on file  ?Food Insecurity: Not on file  ?Transportation Needs: Not on file  ?Physical Activity: Not on file  ?Stress: Not on file  ?Social Connections: Not on file  ?Intimate Partner Violence: Not on file  ? ? ? ? ?Review of Systems  ?Constitutional:  Negative for chills, fatigue and unexpected weight change.  ?HENT:  Negative for congestion, rhinorrhea, sneezing and sore throat.   ?Eyes:  Negative for redness.  ?Respiratory:  Negative for  cough, chest tightness and shortness of breath.   ?Cardiovascular:  Negative for chest pain and palpitations.  ?Gastrointestinal:  Negative for abdominal pain, constipation, diarrhea, nausea and vomiting.  ?Genitourinary:  Negative for dysuria and frequency.  ?Musculoskeletal:  Negative for arthralgias, back pain, joint swelling and neck pain.  ?Skin:  Negative for rash.  ?Neurological: Negative.  Negative for tremors and numbness.  ?Hematological:  Negative for adenopathy. Does not bruise/bleed easily.  ?Psychiatric/Behavioral:  Negative for behavioral problems (Depression), sleep disturbance and suicidal ideas. The patient is not nervous/anxious.   ? ?Vital Signs: ?BP 128/68   Pulse 78   Temp  98 ?F (36.7 ?C)   Resp 16   Ht 4\' 11"  (1.499 m)   Wt 141 lb (64 kg)   LMP 01/09/2015 (Approximate)   SpO2 98%   BMI 28.48 kg/m?  ? ? ?Physical Exam ?Vitals reviewed.  ?Constitutional:   ?   General: She is not in acute distress. ?   Appearance: Normal appearance. She is not ill-appearing.  ?HENT:  ?   Head: Normocephalic and atraumatic.  ?Eyes:  ?   Pupils: Pupils are equal, round, and reactive to light.  ?Cardiovascular:  ?   Rate and Rhythm: Normal rate and regular rhythm.  ?Pulmonary:  ?   Effort: Pulmonary effort is normal. No respiratory distress.  ?Neurological:  ?   Mental Status: She is alert and oriented to person, place, and time.  ?Psychiatric:     ?   Mood and Affect: Mood normal.     ?   Behavior: Behavior normal.  ? ? ? ? ? ?Assessment/Plan: ?1. Essential hypertension ?Blood pressure is well controlled with current medications, no changes.  ? ?2. Osteopenia of multiple sites ?Patient discussed with 14/02/2014 at her last office visit in December. She is supplementing with calcium and vitamin D. She has no additional questions about this problem. ? ?3. Mixed hyperlipidemia ?Currently taking rosuvastatin, will continue to monitor.  ? ?4. Need for shingles vaccine ?- Zoster Vaccine Adjuvanted [facility]) injection; Inject 0.5 mLs into the muscle once for 1 dose.  Dispense: 0.5 mL; Refill: 0 ? ? ?General Counseling: [facility] understanding of the findings of todays visit and agrees with plan of treatment. I have discussed any further diagnostic evaluation that may be needed or ordered today. We also reviewed her medications today. she has been encouraged to call the office with any questions or concerns that should arise related to todays visit. ? ? ? ?No orders of the defined types were placed in this encounter. ? ? ?Meds ordered this encounter  ?Medications  ? Zoster Vaccine Adjuvanted [facility]) injection  ?  Sig: Inject 0.5 mLs into the muscle once for 1 dose.  ?  Dispense:  0.5 mL  ?   Refill:  0  ? ? ?Return in about 6 months (around 11/10/2021) for F/U with lauren. ? ? ?Total time spent:30 Minutes ?Time spent includes review of chart, medications, test results, and follow up plan with the patient.  ? ?[place] Controlled Substance Database was reviewed by me. ? ?This patient was seen by 01/10/2022, FNP-C in collaboration with Dr. Sallyanne Kuster as a part of collaborative care agreement. ? ? ?Nivedita Mirabella R. Beverely Risen, MSN, FNP-C ?Internal medicine  ?

## 2021-05-21 ENCOUNTER — Ambulatory Visit: Payer: BC Managed Care – PPO | Admitting: Physician Assistant

## 2021-05-21 ENCOUNTER — Encounter: Payer: Self-pay | Admitting: Physician Assistant

## 2021-05-21 DIAGNOSIS — I10 Essential (primary) hypertension: Secondary | ICD-10-CM | POA: Diagnosis not present

## 2021-05-21 DIAGNOSIS — Z7189 Other specified counseling: Secondary | ICD-10-CM | POA: Diagnosis not present

## 2021-05-21 DIAGNOSIS — G4733 Obstructive sleep apnea (adult) (pediatric): Secondary | ICD-10-CM | POA: Diagnosis not present

## 2021-05-21 NOTE — Progress Notes (Signed)
[facility] ?[address] ?[place], [place] [address] ? ?Pulmonary Sleep Medicine  ? ?Office Visit Note ? ?Patient Name: Barbara Cantrell ?DOB: April 22, 1965 ?MRN [id number] ? ?Date of Service: 05/21/2021 ? ?Complaints/HPI: Pt is here for routine pulmonology follow up. She has been using and benefiting from her CPAP. She denies SOB, dryness, or headaches. She cleans her machine and changes supplies regularly. Her download shows 89% compliance with AHI controlled at 0.8/hr. She has no concerns today. ? ?ROS ? ?General: (-) fever, (-) chills, (-) night sweats, (-) weakness ?Skin: (-) rashes, (-) itching,. ?Eyes: (-) visual changes, (-) redness, (-) itching. ?Nose and Sinuses: (-) nasal stuffiness or itchiness, (-) postnasal drip, (-) nosebleeds, (-) sinus trouble. ?Mouth and Throat: (-) sore throat, (-) hoarseness. ?Neck: (-) swollen glands, (-) enlarged thyroid, (-) neck pain. ?Respiratory: - cough, (-) bloody sputum, - shortness of breath, - wheezing. ?Cardiovascular: - ankle swelling, (-) chest pain. ?Lymphatic: (-) lymph node enlargement. ?Neurologic: (-) numbness, (-) tingling. ?Psychiatric: (-) anxiety, (-) depression ? ? ?Current Medication: ?Outpatient Encounter Medications as of 05/21/2021  ?Medication Sig Note  ? amLODipine (NORVASC) 5 MG tablet TAKE ONE TAB PO QD   ? cyclobenzaprine (FLEXERIL) 5 MG tablet Take 1 tablet (5 mg total) by mouth 2 (two) times daily as needed for muscle spasms.   ? ergocalciferol (DRISDOL) 1.25 MG (50000 UT) capsule Take 1 capsule (50,000 Units total) by mouth once a week.   ? fluticasone (FLONASE) 50 MCG/ACT nasal spray Place 1 spray into both nostrils.   ? ibuprofen (ADVIL,MOTRIN) 600 MG tablet TAKE 1 TABLET(S) BY MOUTH THREE TIMES A DAY AS NEEDED FOR PAIN 02/11/2015: Received from: External Pharmacy  ? rosuvastatin (CRESTOR) 5 MG tablet Take 1 tablet (5 mg total) by mouth daily.   ? triamterene-hydrochlorothiazide (MAXZIDE-25) 37.5-25 MG tablet Take 1 tablet by mouth  daily.   ? ?No facility-administered encounter medications on file as of 05/21/2021.  ? ? ?Surgical History: ?Past Surgical History:  ?Procedure Laterality Date  ? TUBAL LIGATION    ? ? ?Medical History: ?Past Medical History:  ?Diagnosis Date  ? Constipation   ? Hypertension   ? Pelvic pain in female   ? PMB (postmenopausal bleeding)   ? Postmenopausal bleeding 02/11/2015  ? Sleep apnea   ? ? ?Family History: ?Family History  ?Problem Relation Age of Onset  ? Ovarian cancer Sister   ? Colon cancer Brother   ? Asthma Maternal Aunt   ? Diabetes Maternal Aunt   ? Kidney disease Maternal Aunt   ? Heart disease Neg Hx   ? ? ?Social History: ?Social History  ? ?Socioeconomic History  ? Marital status: Married  ?  Spouse name: Not on file  ? Number of children: Not on file  ? Years of education: Not on file  ? Highest education level: Not on file  ?Occupational History  ? Not on file  ?Tobacco Use  ? Smoking status: Never  ? Smokeless tobacco: Never  ?Substance and Sexual Activity  ? Alcohol use: Yes  ?  Comment: occas  ? Drug use: No  ? Sexual activity: Yes  ?  Birth control/protection: Surgical  ?Other Topics Concern  ? Not on file  ?Social History Narrative  ? Not on file  ? ?Social Determinants of Health  ? ?Financial Resource Strain: Not on file  ?Food Insecurity: Not on file  ?Transportation Needs: Not on file  ?Physical Activity: Not on file  ?Stress: Not on file  ?Social  Connections: Not on file  ?Intimate Partner Violence: Not on file  ? ? ?Vital Signs: ?Blood pressure 136/80, pulse 73, temperature 98.1 ?F (36.7 ?C), resp. rate 16, height '4\' 11"'  (1.499 m), weight 142 lb 3.2 oz (64.5 kg), last menstrual period 01/09/2015, SpO2 97 %. ? ?Examination: ?General Appearance: The patient is well-developed, well-nourished, and in no distress. ?Skin: Gross inspection of skin unremarkable. ?Head: normocephalic, no gross deformities. ?Eyes: no gross deformities noted. ?ENT: ears appear grossly normal no exudates. ?Neck: Supple.  No thyromegaly. No LAD. ?Respiratory: Lungs clear to auscultation bilaterally. ?Cardiovascular: Normal S1 and S2 without murmur or rub. ?Extremities: No cyanosis. pulses are equal. ?Neurologic: Alert and oriented. No involuntary movements. ? ?LABS: ?Recent Results (from the past 2160 hour(s))  ?TSH + free T4     Status: None  ? Collection Time: 03/04/21 11:12 AM  ?Result Value Ref Range  ? TSH 0.721 0.450 - 4.500 uIU/mL  ? Free T4 1.11 0.82 - 1.77 ng/dL  ?CBC w/Diff/Platelet     Status: Abnormal  ? Collection Time: 03/04/21 11:12 AM  ?Result Value Ref Range  ? WBC 8.3 3.4 - 10.8 x10E3/uL  ? RBC 4.56 3.77 - 5.28 x10E6/uL  ? Hemoglobin 12.9 11.1 - 15.9 g/dL  ? Hematocrit 38.0 34.0 - 46.6 %  ? MCV 83 79 - 97 fL  ? MCH 28.3 26.6 - 33.0 pg  ? MCHC 33.9 31.5 - 35.7 g/dL  ? RDW 13.9 11.7 - 15.4 %  ? Platelets 383 150 - 450 x10E3/uL  ? Neutrophils 40 Not Estab. %  ? Lymphs 51 Not Estab. %  ? Monocytes 4 Not Estab. %  ? Eos 5 Not Estab. %  ? Basos 0 Not Estab. %  ? Neutrophils Absolute 3.3 1.4 - 7.0 x10E3/uL  ? Lymphocytes Absolute 4.2 (H) 0.7 - 3.1 x10E3/uL  ? Monocytes Absolute 0.4 0.1 - 0.9 x10E3/uL  ? EOS (ABSOLUTE) 0.4 0.0 - 0.4 x10E3/uL  ? Basophils Absolute 0.0 0.0 - 0.2 x10E3/uL  ? Immature Granulocytes 0 Not Estab. %  ? Immature Grans (Abs) 0.0 0.0 - 0.1 x10E3/uL  ?Comprehensive metabolic panel     Status: None  ? Collection Time: 03/04/21 11:12 AM  ?Result Value Ref Range  ? Glucose 97 70 - 99 mg/dL  ? BUN 17 6 - 24 mg/dL  ? Creatinine, Ser 0.91 0.57 - 1.00 mg/dL  ? eGFR 74 >59 mL/min/1.73  ? BUN/Creatinine Ratio 19 9 - 23  ? Sodium 143 134 - 144 mmol/L  ? Potassium 4.2 3.5 - 5.2 mmol/L  ? Chloride 103 96 - 106 mmol/L  ? CO2 23 20 - 29 mmol/L  ? Calcium 9.6 8.7 - 10.2 mg/dL  ? Total Protein 7.9 6.0 - 8.5 g/dL  ? Albumin 4.5 3.8 - 4.9 g/dL  ? Globulin, Total 3.4 1.5 - 4.5 g/dL  ? Albumin/Globulin Ratio 1.3 1.2 - 2.2  ? Bilirubin Total 0.4 0.0 - 1.2 mg/dL  ? Alkaline Phosphatase 61 44 - 121 IU/L  ? AST 14 0 - 40 IU/L   ? ALT 16 0 - 32 IU/L  ?VITAMIN D 25 Hydroxy (Vit-D Deficiency, Fractures)     Status: None  ? Collection Time: 03/04/21 11:12 AM  ?Result Value Ref Range  ? Vit D, 25-Hydroxy 32.1 30.0 - 100.0 ng/mL  ?  Comment: Vitamin D deficiency has been defined by the Institute of ?Medicine and an Endocrine Society practice guideline as a ?level of serum 25-OH vitamin D less than 20 ng/mL (1,2). ?  The Endocrine Society went on to further define vitamin D ?insufficiency as a level between 21 and 29 ng/mL (2). ?1. IOM (Institute of Medicine). 2010. Dietary reference ?   intakes for calcium and D. [place]: The ?   [employer]. ?2. Holick MF, Binkley [place], Bischoff-Ferrari HA, et al. ?   Evaluation, treatment, and prevention of vitamin D ?   deficiency: an Endocrine Society clinical practice ?   guideline. JCEM. 2011 Jul; 96(7):1911-30. ?  ?Lipid Panel With LDL/HDL Ratio     Status: Abnormal  ? Collection Time: 03/04/21 11:12 AM  ?Result Value Ref Range  ? Cholesterol, Total 166 100 - 199 mg/dL  ? Triglycerides 211 (H) 0 - 149 mg/dL  ? HDL 41 >39 mg/dL  ? VLDL Cholesterol Cal 36 5 - 40 mg/dL  ? LDL Chol Calc (NIH) 89 0 - 99 mg/dL  ? LDL/HDL Ratio 2.2 0.0 - 3.2 ratio  ?  Comment:                                     LDL/HDL Ratio ?                                            Men  Women ?                              1/2 Avg.Risk  1.0    1.5 ?                                  Avg.Risk  3.6    3.2 ?                               2X Avg.Risk  6.2    5.0 ?                               3X Avg.Risk  8.0    6.1 ?  ?B12 and Folate Panel     Status: None  ? Collection Time: 03/04/21 11:12 AM  ?Result Value Ref Range  ? Vitamin B-12 460 232 - 1,245 pg/mL  ? Folate 10.7 >3.0 ng/mL  ?  Comment: A serum folate concentration of less than 3.1 ng/mL is ?considered to represent clinical deficiency. ?  ? ? ?Radiology: ?DG Bone Density ? ?Result Date: 10/21/2020 ?EXAM: DUAL X-RAY ABSORPTIOMETRY (DXA) FOR BONE MINERAL DENSITY IMPRESSION:  Dear Dr. Humphrey Rolls, Your patient QUINLYNN CUTHBERT completed a FRAX assessment on 10/21/2020 using the [place] (analysis version: 14.10) manufactured by [employer]. The following summarizes the

## 2021-06-22 DIAGNOSIS — G4733 Obstructive sleep apnea (adult) (pediatric): Secondary | ICD-10-CM | POA: Diagnosis not present

## 2021-07-01 ENCOUNTER — Other Ambulatory Visit: Payer: Self-pay

## 2021-07-01 ENCOUNTER — Encounter: Payer: Self-pay | Admitting: Emergency Medicine

## 2021-07-01 ENCOUNTER — Emergency Department
Admission: EM | Admit: 2021-07-01 | Discharge: 2021-07-01 | Disposition: A | Discharge status: Home / Self Care | Payer: BC Managed Care – PPO | Attending: Emergency Medicine | Admitting: Emergency Medicine

## 2021-07-01 ENCOUNTER — Emergency Department: Payer: BC Managed Care – PPO

## 2021-07-01 DIAGNOSIS — Z041 Encounter for examination and observation following transport accident: Secondary | ICD-10-CM | POA: Diagnosis not present

## 2021-07-01 DIAGNOSIS — M25511 Pain in right shoulder: Secondary | ICD-10-CM | POA: Diagnosis not present

## 2021-07-01 DIAGNOSIS — M47814 Spondylosis without myelopathy or radiculopathy, thoracic region: Secondary | ICD-10-CM | POA: Diagnosis not present

## 2021-07-01 DIAGNOSIS — M25561 Pain in right knee: Secondary | ICD-10-CM | POA: Diagnosis not present

## 2021-07-01 DIAGNOSIS — R0782 Intercostal pain: Secondary | ICD-10-CM | POA: Insufficient documentation

## 2021-07-01 DIAGNOSIS — M542 Cervicalgia: Secondary | ICD-10-CM | POA: Insufficient documentation

## 2021-07-01 DIAGNOSIS — S79911A Unspecified injury of right hip, initial encounter: Secondary | ICD-10-CM | POA: Diagnosis not present

## 2021-07-01 DIAGNOSIS — M25571 Pain in right ankle and joints of right foot: Secondary | ICD-10-CM | POA: Insufficient documentation

## 2021-07-01 DIAGNOSIS — S99911A Unspecified injury of right ankle, initial encounter: Secondary | ICD-10-CM | POA: Diagnosis not present

## 2021-07-01 DIAGNOSIS — S40811A Abrasion of right upper arm, initial encounter: Secondary | ICD-10-CM | POA: Insufficient documentation

## 2021-07-01 DIAGNOSIS — M25551 Pain in right hip: Secondary | ICD-10-CM | POA: Diagnosis not present

## 2021-07-01 DIAGNOSIS — S4991XA Unspecified injury of right shoulder and upper arm, initial encounter: Secondary | ICD-10-CM | POA: Diagnosis not present

## 2021-07-01 DIAGNOSIS — S299XXA Unspecified injury of thorax, initial encounter: Secondary | ICD-10-CM | POA: Diagnosis not present

## 2021-07-01 DIAGNOSIS — R0781 Pleurodynia: Secondary | ICD-10-CM

## 2021-07-01 DIAGNOSIS — S59911A Unspecified injury of right forearm, initial encounter: Secondary | ICD-10-CM | POA: Diagnosis not present
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
## 2021-07-01 NOTE — Discharge Instructions (Signed)
Please use ibuprofen (Motrin) up to 800 mg every 8 hours, naproxen (Naprosyn) up to 500 mg every 12 hours, and/or acetaminophen (Tylenol) up to 4 g/day for any continued pain 

## 2021-07-01 NOTE — ED Notes (Signed)
Pt attempted to signed for d/c paperwork and education but topaz frozen.

## 2021-07-01 NOTE — ED Notes (Signed)
See triage note. When this RN first attempted to assess pt she was already off unit at [facility]. Currently pt's resp reg/unlabored, skin dry, and pt sitting calmly in chair. Pt able to move limbs appropriately; pt remains sore.
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
## 2021-09-21 DIAGNOSIS — G4733 Obstructive sleep apnea (adult) (pediatric): Secondary | ICD-10-CM | POA: Diagnosis not present

## 2021-10-02 ENCOUNTER — Encounter: Payer: BC Managed Care – PPO | Admitting: Physician Assistant

## 2021-10-07 ENCOUNTER — Ambulatory Visit: Payer: BC Managed Care – PPO

## 2021-10-14 ENCOUNTER — Other Ambulatory Visit: Payer: Self-pay | Admitting: Internal Medicine

## 2021-10-14 ENCOUNTER — Ambulatory Visit (INDEPENDENT_AMBULATORY_CARE_PROVIDER_SITE_OTHER): Payer: BC Managed Care – PPO

## 2021-10-14 ENCOUNTER — Encounter: Payer: BC Managed Care – PPO | Admitting: Nurse Practitioner

## 2021-10-14 DIAGNOSIS — I10 Essential (primary) hypertension: Secondary | ICD-10-CM

## 2021-10-14 DIAGNOSIS — G4733 Obstructive sleep apnea (adult) (pediatric): Secondary | ICD-10-CM | POA: Diagnosis not present

## 2021-10-14 NOTE — Progress Notes (Signed)
95 percentile pressure 8   95th percentile leak 6.2   apnea index 0.1 /hr  apnea-hypopnea index  0.4 /hr   total days used  >4 hr 69 days  total days used <4 hr 10 days  Total compliance 77 percent  She is doing better and sleeping better no problems or questions at this time Pt was seen by Tresa Endo  RRT/RCP  from [facility]

## 2021-10-20 DIAGNOSIS — Z1231 Encounter for screening mammogram for malignant neoplasm of breast: Secondary | ICD-10-CM | POA: Diagnosis not present

## 2021-10-26 ENCOUNTER — Telehealth: Payer: Self-pay | Admitting: Nurse Practitioner

## 2021-10-26 NOTE — Telephone Encounter (Signed)
Left vm and sent mychart message to confirm 11/02/21 appointment-Toni 

## 2021-11-02 ENCOUNTER — Ambulatory Visit (INDEPENDENT_AMBULATORY_CARE_PROVIDER_SITE_OTHER): Payer: BC Managed Care – PPO | Admitting: Nurse Practitioner

## 2021-11-02 ENCOUNTER — Encounter: Payer: Self-pay | Admitting: Nurse Practitioner

## 2021-11-02 VITALS — BP 134/78 | HR 75 | Temp 98.5°F | Resp 16 | Ht 59.0 in | Wt 143.2 lb

## 2021-11-02 DIAGNOSIS — E538 Deficiency of other specified B group vitamins: Secondary | ICD-10-CM

## 2021-11-02 DIAGNOSIS — R300 Dysuria: Secondary | ICD-10-CM | POA: Diagnosis not present

## 2021-11-02 DIAGNOSIS — Z23 Encounter for immunization: Secondary | ICD-10-CM

## 2021-11-02 DIAGNOSIS — I10 Essential (primary) hypertension: Secondary | ICD-10-CM | POA: Diagnosis not present

## 2021-11-02 DIAGNOSIS — E782 Mixed hyperlipidemia: Secondary | ICD-10-CM | POA: Diagnosis not present

## 2021-11-02 DIAGNOSIS — Z0001 Encounter for general adult medical examination with abnormal findings: Secondary | ICD-10-CM | POA: Diagnosis not present

## 2021-11-02 MED ORDER — ROSUVASTATIN CALCIUM 5 MG PO TABS: 5.0000 mg | ORAL_TABLET | Freq: Every day | ORAL | 3 refills | Status: DC

## 2021-11-02 MED ORDER — ZOSTER VAC RECOMB ADJUVANTED 50 MCG/0.5ML IM SUSR: 0.5000 mL | Freq: Once | INTRAMUSCULAR | 0 refills | Status: AC

## 2021-11-02 MED ORDER — ZOSTER VAC RECOMB ADJUVANTED 50 MCG/0.5ML IM SUSR: 0.5000 mL | Freq: Once | INTRAMUSCULAR | 0 refills | Status: DC

## 2021-11-02 NOTE — Progress Notes (Signed)
[facility] [place], [place] [address]  Internal MEDICINE  Office Visit Note  Patient Name: Barbara Cantrell  [id number]  [id number]  Date of Service: 11/02/2021  Chief Complaint  Patient presents with   Annual Exam   Hypertension     Ammi presents for an annual well visit and physical exam.  Well-appearing 55 year old female with hypertension, OSA, high cholesterol and intermittent low back pain.  Preventive screenings:  Mammogram done 10/20/21 Pap due next year Colonoscopy done 10/2020 DEXA scan done 10/2020 --BP controlled with current medication --taking rosuvastatin for high cholesterol --due for shingles vaccine and routine labs      Current Medication: Outpatient Encounter Medications as of 11/02/2021  Medication Sig Note   amLODipine (NORVASC) 5 MG tablet TAKE 1 TABLET BY MOUTH EVERY DAY    cyclobenzaprine (FLEXERIL) 5 MG tablet Take 1 tablet (5 mg total) by mouth 2 (two) times daily as needed for muscle spasms.    ergocalciferol (DRISDOL) 1.25 MG (50000 UT) capsule Take 1 capsule (50,000 Units total) by mouth once a week.    fluticasone (FLONASE) 50 MCG/ACT nasal spray Place 1 spray into both nostrils.    ibuprofen (ADVIL,MOTRIN) 600 MG tablet TAKE 1 TABLET(S) BY MOUTH THREE TIMES A DAY AS NEEDED FOR PAIN 02/11/2015: Received from: External Pharmacy   triamterene-hydrochlorothiazide (MAXZIDE-25) 37.5-25 MG tablet TAKE 1 TABLET BY MOUTH EVERY DAY    Zoster Vaccine Adjuvanted [facility]) injection Inject 0.5 mLs into the muscle once.    [DISCONTINUED] rosuvastatin (CRESTOR) 5 MG tablet Take 1 tablet (5 mg total) by mouth daily.    [DISCONTINUED] Zoster Vaccine Adjuvanted [facility]) injection Inject 0.5 mLs into the muscle once.    rosuvastatin (CRESTOR) 5 MG tablet Take 1 tablet (5 mg total) by mouth daily.    Zoster Vaccine Adjuvanted [facility]) injection Inject 0.5 mLs into the muscle once for 1 dose.    [DISCONTINUED] Zoster Vaccine Adjuvanted  [facility]) injection Inject 0.5 mLs into the muscle once for 1 dose.    No facility-administered encounter medications on file as of 11/02/2021.    Surgical History: Past Surgical History:  Procedure Laterality Date   TUBAL LIGATION      Medical History: Past Medical History:  Diagnosis Date   Constipation    Hypertension    Pelvic pain in female    PMB (postmenopausal bleeding)    Postmenopausal bleeding 02/11/2015   Sleep apnea     Family History: Family History  Problem Relation Age of Onset   Ovarian cancer Sister    Colon cancer Brother    Asthma Maternal Aunt    Diabetes Maternal Aunt    Kidney disease Maternal Aunt    Heart disease Neg Hx     Social History   Socioeconomic History   Marital status: Married    Spouse name: Not on file   Number of children: Not on file   Years of education: Not on file   Highest education level: Not on file  Occupational History   Not on file  Tobacco Use   Smoking status: Never   Smokeless tobacco: Never  Substance and Sexual Activity   Alcohol use: Yes    Comment: occas   Drug use: No   Sexual activity: Yes    Birth control/protection: Surgical  Other Topics Concern   Not on file  Social History Narrative   Not on file   Social Determinants of Health   Financial Resource Strain: Not on file  Food Insecurity: Not on file  Transportation Needs: Not on file  Physical Activity: Not on file  Stress: Not on file  Social Connections: Not on file  Intimate Partner Violence: Not on file      Review of Systems  Constitutional:  Negative for activity change, appetite change, chills, fatigue, fever and unexpected weight change.  HENT: Negative.  Negative for congestion, ear pain, rhinorrhea, sore throat and trouble swallowing.   Eyes: Negative.   Respiratory: Negative.  Negative for cough, chest tightness, shortness of breath and wheezing.   Cardiovascular: Negative.  Negative for chest pain.  Gastrointestinal:  Negative.  Negative for abdominal pain, blood in stool, constipation, diarrhea, nausea and vomiting.  Endocrine: Negative.   Genitourinary: Negative.  Negative for difficulty urinating, dysuria, frequency, hematuria and urgency.  Musculoskeletal:  Positive for arthralgias and back pain. Negative for joint swelling, myalgias and neck pain.  Skin:  Negative for wound.  Allergic/Immunologic: Negative.  Negative for immunocompromised state.  Neurological: Negative.  Negative for dizziness, seizures, numbness and headaches.  Hematological: Negative.   Psychiatric/Behavioral: Negative.  Negative for behavioral problems, self-injury and suicidal ideas. The patient is not nervous/anxious.     Vital Signs: BP 134/78   Pulse 75   Temp 98.5 F (36.9 C)   Resp 16   Ht '4\' 11"'  (1.499 m)   Wt 143 lb 3.2 oz (65 kg)   LMP 01/09/2015 (Approximate)   SpO2 99%   BMI 28.92 kg/m    Physical Exam Vitals reviewed.  Constitutional:      General: She is awake. She is not in acute distress.    Appearance: Normal appearance. She is well-developed and well-groomed. She is obese. She is not ill-appearing or diaphoretic.  HENT:     Head: Normocephalic and atraumatic.     Right Ear: Tympanic membrane, ear canal and external ear normal.     Left Ear: Tympanic membrane, ear canal and external ear normal.     Nose: Nose normal. No congestion or rhinorrhea.     Mouth/Throat:     Lips: Pink.     Mouth: Mucous membranes are moist.     Pharynx: Oropharynx is clear. Uvula midline. No oropharyngeal exudate or posterior oropharyngeal erythema.  Eyes:     General: Lids are normal. Vision grossly intact. Gaze aligned appropriately. No scleral icterus.       Right eye: No discharge.        Left eye: No discharge.     Extraocular Movements: Extraocular movements intact.     Conjunctiva/sclera: Conjunctivae normal.     Pupils: Pupils are equal, round, and reactive to light.     Funduscopic exam:    Right eye: Red  reflex present.        Left eye: Red reflex present. Neck:     Thyroid: No thyromegaly.     Vascular: No carotid bruit or JVD.     Trachea: Trachea and phonation normal. No tracheal deviation.  Cardiovascular:     Rate and Rhythm: Normal rate and regular rhythm.     Pulses: Normal pulses.     Heart sounds: Normal heart sounds, S1 normal and S2 normal. No murmur heard.    No friction rub. No gallop.  Pulmonary:     Effort: Pulmonary effort is normal. No accessory muscle usage or respiratory distress.     Breath sounds: Normal air entry. No stridor. No decreased breath sounds, wheezing or rales.  Chest:     Chest wall: No tenderness.  Breasts:    Breasts are symmetrical.     Right: Normal. No swelling, bleeding, inverted nipple, mass, nipple discharge, skin change or tenderness.     Left: Normal. No swelling, bleeding, inverted nipple, mass, nipple discharge, skin change or tenderness.  Abdominal:     General: Bowel sounds are normal. There is no distension.     Palpations: Abdomen is soft. There is no mass.     Tenderness: There is no abdominal tenderness. There is no guarding or rebound.  Musculoskeletal:        General: No tenderness or deformity. Normal range of motion.     Cervical back: Normal range of motion and neck supple.     Right lower leg: No edema.     Left lower leg: No edema.  Lymphadenopathy:     Cervical: No cervical adenopathy.     Upper Body:     Right upper body: No supraclavicular, axillary or pectoral adenopathy.     Left upper body: No supraclavicular, axillary or pectoral adenopathy.  Skin:    General: Skin is warm and dry.     Capillary Refill: Capillary refill takes less than 2 seconds.     Coloration: Skin is not pale.     Findings: No erythema or rash.  Neurological:     Mental Status: She is alert and oriented to person, place, and time.     Cranial Nerves: No cranial nerve deficit.     Motor: No abnormal muscle tone.     Coordination:  Coordination normal.     Gait: Gait normal.     Deep Tendon Reflexes: Reflexes are normal and symmetric.  Psychiatric:        Mood and Affect: Mood normal.        Behavior: Behavior normal. Behavior is cooperative.        Thought Content: Thought content normal.        Judgment: Judgment normal.        Assessment/Plan: 1. Encounter for general adult medical examination with abnormal findings Age-appropriate preventive screenings and vaccinations discussed, annual physical exam completed. Routine labs for health maintenance ordered, . PHM updated.  - CBC with Differential/Platelet - B12 and Folate Panel - Lipid Profile - CMP14+EGFR  2. Essential hypertension Stable, continue medications as prescribed, labs ordered - CMP14+EGFR  3. Mixed hyperlipidemia Routine labs ordered including lipid panel, continue rosuvastatin as prescribed.  - Lipid Profile - CMP14+EGFR - rosuvastatin (CRESTOR) 5 MG tablet; Take 1 tablet (5 mg total) by mouth daily.  Dispense: 90 tablet; Refill: 3  4. B12 deficiency Routine labs ordered - CBC with Differential/Platelet - B12 and Folate Panel - CMP14+EGFR  5. Dysuria Routine urinalysis done  - UA/M w/rflx Culture, Routine  6. Need for vaccination - Zoster Vaccine Adjuvanted [facility]) injection; Inject 0.5 mLs into the muscle once for 1 dose.  Dispense: 0.5 mL; Refill: 0      General Counseling: Barbara Cantrell verbalizes understanding of the findings of todays visit and agrees with plan of treatment. I have discussed any further diagnostic evaluation that may be needed or ordered today. We also reviewed her medications today. she has been encouraged to call the office with any questions or concerns that should arise related to todays visit.    Orders Placed This Encounter  Procedures   UA/M w/rflx Culture, Routine   CBC with Differential/Platelet   B12 and Folate Panel   Lipid Profile   CMP14+EGFR    Meds ordered this encounter  Medications  DISCONTD: Zoster Vaccine Adjuvanted [facility]) injection    Sig: Inject 0.5 mLs into the muscle once for 1 dose.    Dispense:  0.5 mL    Refill:  0   Zoster Vaccine Adjuvanted [facility]) injection    Sig: Inject 0.5 mLs into the muscle once for 1 dose.    Dispense:  0.5 mL    Refill:  0   rosuvastatin (CRESTOR) 5 MG tablet    Sig: Take 1 tablet (5 mg total) by mouth daily.    Dispense:  90 tablet    Refill:  3    For future refills    Return in about 6 months (around 05/03/2022) for F/U, med refill, Dolorez Jeffrey PCP.   Total time spent:30 Minutes Time spent includes review of chart, medications, test results, and follow up plan with the patient.   [place] Controlled Substance Database was reviewed by me.  This patient was seen by Jonetta Osgood, FNP-C in collaboration with Dr. Clayborn Bigness as a part of collaborative care agreement.  Jatinder Mcdonagh R. Valetta Fuller, MSN, FNP-C Internal medicine

## 2021-11-05 LAB — MICROSCOPIC EXAMINATION
Bacteria, UA: NONE SEEN
Casts: NONE SEEN /lpf

## 2021-11-05 LAB — UA/M W/RFLX CULTURE, ROUTINE
Bilirubin, UA: NEGATIVE
Glucose, UA: NEGATIVE
Ketones, UA: NEGATIVE
Nitrite, UA: NEGATIVE
Protein,UA: NEGATIVE
RBC, UA: NEGATIVE
Specific Gravity, UA: 1.017 (ref 1.005–1.030)
Urobilinogen, Ur: 0.2 mg/dL (ref 0.2–1.0)
pH, UA: 5.5 (ref 5.0–7.5)

## 2021-11-05 LAB — URINE CULTURE, REFLEX

## 2021-11-09 ENCOUNTER — Ambulatory Visit: Payer: BC Managed Care – PPO | Admitting: Physician Assistant

## 2021-11-10 ENCOUNTER — Other Ambulatory Visit: Payer: Self-pay

## 2021-11-10 MED ORDER — TRIAMTERENE-HCTZ 37.5-25 MG PO TABS: 1.0000 | ORAL_TABLET | Freq: Every day | ORAL | 1 refills | Status: DC

## 2021-11-20 ENCOUNTER — Ambulatory Visit: Payer: BC Managed Care – PPO | Admitting: Physician Assistant

## 2021-12-14 ENCOUNTER — Ambulatory Visit: Payer: BC Managed Care – PPO | Admitting: Physician Assistant

## 2021-12-14 ENCOUNTER — Encounter: Payer: Self-pay | Admitting: Physician Assistant

## 2021-12-14 VITALS — BP 125/81 | HR 75 | Temp 98.0°F | Resp 16 | Ht 59.0 in | Wt 141.0 lb

## 2021-12-14 DIAGNOSIS — G4733 Obstructive sleep apnea (adult) (pediatric): Secondary | ICD-10-CM

## 2021-12-14 DIAGNOSIS — I10 Essential (primary) hypertension: Secondary | ICD-10-CM | POA: Diagnosis not present

## 2021-12-14 DIAGNOSIS — Z7189 Other specified counseling: Secondary | ICD-10-CM
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
## 2021-12-23 DIAGNOSIS — G4733 Obstructive sleep apnea (adult) (pediatric): Secondary | ICD-10-CM | POA: Diagnosis not present

## 2022-02-09 ENCOUNTER — Telehealth: Payer: Self-pay | Admitting: Nurse Practitioner

## 2022-02-09 NOTE — Telephone Encounter (Signed)
Lvm to move 05/03/22 appointment-Toni 

## 2022-03-23 DIAGNOSIS — G4733 Obstructive sleep apnea (adult) (pediatric): Secondary | ICD-10-CM | POA: Diagnosis not present

## 2022-04-14 ENCOUNTER — Ambulatory Visit: Payer: BC Managed Care – PPO

## 2022-04-15 DIAGNOSIS — Z0001 Encounter for general adult medical examination with abnormal findings: Secondary | ICD-10-CM | POA: Diagnosis not present

## 2022-04-15 DIAGNOSIS — E538 Deficiency of other specified B group vitamins: Secondary | ICD-10-CM | POA: Diagnosis not present

## 2022-04-15 DIAGNOSIS — E782 Mixed hyperlipidemia: Secondary | ICD-10-CM | POA: Diagnosis not present

## 2022-04-15 DIAGNOSIS — R300 Dysuria: Secondary | ICD-10-CM | POA: Diagnosis not present

## 2022-04-15 DIAGNOSIS — I10 Essential (primary) hypertension: Secondary | ICD-10-CM | POA: Diagnosis not present

## 2022-04-16 ENCOUNTER — Other Ambulatory Visit: Payer: Self-pay | Admitting: Physician Assistant

## 2022-04-16 DIAGNOSIS — I10 Essential (primary) hypertension: Secondary | ICD-10-CM

## 2022-04-16 LAB — CMP14+EGFR
ALT: 21 IU/L (ref 0–32)
AST: 17 IU/L (ref 0–40)
Albumin/Globulin Ratio: 1.3 (ref 1.2–2.2)
Albumin: 4.7 g/dL (ref 3.8–4.9)
Alkaline Phosphatase: 73 IU/L (ref 44–121)
BUN/Creatinine Ratio: 17 (ref 9–23)
BUN: 15 mg/dL (ref 6–24)
Bilirubin Total: 0.4 mg/dL (ref 0.0–1.2)
CO2: 22 mmol/L (ref 20–29)
Calcium: 9.5 mg/dL (ref 8.7–10.2)
Chloride: 102 mmol/L (ref 96–106)
Creatinine, Ser: 0.9 mg/dL (ref 0.57–1.00)
Globulin, Total: 3.6 g/dL (ref 1.5–4.5)
Glucose: 104 mg/dL — ABNORMAL HIGH (ref 70–99)
Potassium: 4.7 mmol/L (ref 3.5–5.2)
Sodium: 140 mmol/L (ref 134–144)
Total Protein: 8.3 g/dL (ref 6.0–8.5)
eGFR: 75 mL/min/{1.73_m2} (ref >59)

## 2022-04-16 LAB — CBC WITH DIFFERENTIAL/PLATELET
Basophils Absolute: 0.1 10*3/uL (ref 0.0–0.2)
Basos: 1 %
EOS (ABSOLUTE): 0.3 10*3/uL (ref 0.0–0.4)
Eos: 4 %
Hematocrit: 42.8 % (ref 34.0–46.6)
Hemoglobin: 14 g/dL (ref 11.1–15.9)
Immature Grans (Abs): 0 10*3/uL (ref 0.0–0.1)
Immature Granulocytes: 0 %
Lymphocytes Absolute: 3.9 10*3/uL — ABNORMAL HIGH (ref 0.7–3.1)
Lymphs: 51 %
MCH: 27.8 pg (ref 26.6–33.0)
MCHC: 32.7 g/dL (ref 31.5–35.7)
MCV: 85 fL (ref 79–97)
Monocytes Absolute: 0.4 10*3/uL (ref 0.1–0.9)
Monocytes: 5 %
Neutrophils Absolute: 2.9 10*3/uL (ref 1.4–7.0)
Neutrophils: 39 %
Platelets: 397 10*3/uL (ref 150–450)
RBC: 5.03 x10E6/uL (ref 3.77–5.28)
RDW: 13.8 % (ref 11.7–15.4)
WBC: 7.5 10*3/uL (ref 3.4–10.8)

## 2022-04-16 LAB — LIPID PANEL
Chol/HDL Ratio: 4.3 ratio (ref 0.0–4.4)
Cholesterol, Total: 191 mg/dL (ref 100–199)
HDL: 44 mg/dL (ref >39)
LDL Chol Calc (NIH): 109 mg/dL — ABNORMAL HIGH (ref 0–99)
Triglycerides: 217 mg/dL — ABNORMAL HIGH (ref 0–149)
VLDL Cholesterol Cal: 38 mg/dL (ref 5–40)

## 2022-04-16 LAB — B12 AND FOLATE PANEL
Folate: 12.9 ng/mL (ref >3.0)
Vitamin B-12: 490 pg/mL (ref 232–1245)

## 2022-04-16 NOTE — Progress Notes (Signed)
Will discuss results at upcoming appt on 3/22

## 2022-04-28 ENCOUNTER — Ambulatory Visit (INDEPENDENT_AMBULATORY_CARE_PROVIDER_SITE_OTHER): Payer: BC Managed Care – PPO

## 2022-04-28 DIAGNOSIS — G4733 Obstructive sleep apnea (adult) (pediatric): Secondary | ICD-10-CM

## 2022-04-28 NOTE — Progress Notes (Signed)
95 percentile pressure 8   95th percentile leak 1.7   apnea index 0.1 /hr  apnea-hypopnea index  0.5 /hr   total days used  >4 hr 74 days  total days used <4 hr 12 days  Total compliance 82 percent  She is doing very good no problems or questions at this time Pt was seen by Claiborne Billings  RRT/RCP  from [facility]

## 2022-04-30 ENCOUNTER — Ambulatory Visit (INDEPENDENT_AMBULATORY_CARE_PROVIDER_SITE_OTHER): Payer: BC Managed Care – PPO | Admitting: Nurse Practitioner

## 2022-04-30 ENCOUNTER — Encounter: Payer: Self-pay | Admitting: Nurse Practitioner

## 2022-04-30 VITALS — BP 128/60 | HR 92 | Temp 98.4°F | Resp 16 | Ht 59.0 in | Wt 147.8 lb

## 2022-04-30 DIAGNOSIS — Z23 Encounter for immunization: Secondary | ICD-10-CM

## 2022-04-30 DIAGNOSIS — I10 Essential (primary) hypertension: Secondary | ICD-10-CM

## 2022-04-30 DIAGNOSIS — R300 Dysuria: Secondary | ICD-10-CM | POA: Diagnosis not present

## 2022-04-30 DIAGNOSIS — N3001 Acute cystitis with hematuria: Secondary | ICD-10-CM | POA: Diagnosis not present

## 2022-04-30 DIAGNOSIS — E782 Mixed hyperlipidemia: Secondary | ICD-10-CM | POA: Diagnosis not present

## 2022-04-30 LAB — POCT URINALYSIS DIPSTICK
Bilirubin, UA: NEGATIVE
Glucose, UA: NEGATIVE
Ketones, UA: POSITIVE
Leukocytes, UA: NEGATIVE
Nitrite, UA: NEGATIVE
Protein, UA: NEGATIVE
Spec Grav, UA: 1.01 (ref 1.010–1.025)
Urobilinogen, UA: 0.2 E.U./dL
pH, UA: 5 (ref 5.0–8.0)

## 2022-04-30 MED ORDER — SULFAMETHOXAZOLE-TRIMETHOPRIM 800-160 MG PO TABS: 1.0000 | ORAL_TABLET | Freq: Two times a day (BID) | ORAL | 0 refills | Status: AC

## 2022-04-30 MED ORDER — TETANUS-DIPHTH-ACELL PERTUSSIS 5-2.5-18.5 LF-MCG/0.5 IM SUSP: 0.5000 mL | Freq: Once | INTRAMUSCULAR | 0 refills | Status: AC

## 2022-04-30 MED ORDER — ROSUVASTATIN CALCIUM 10 MG PO TABS: 10.0000 mg | ORAL_TABLET | Freq: Every day | ORAL | 1 refills | Status: DC

## 2022-04-30 MED ORDER — TRIAMTERENE-HCTZ 37.5-25 MG PO TABS: 1.0000 | ORAL_TABLET | Freq: Every day | ORAL | 1 refills | Status: DC

## 2022-04-30 NOTE — Progress Notes (Signed)
[facility] [place], [place] [address]  Internal MEDICINE  Office Visit Note  Patient Name: Barbara Cantrell  [id number]  [id number]  Date of Service: 05/01/2022  Chief Complaint  Patient presents with   Hypertension   Follow-up    HPI [place] presents for a follow-up visit for lab results, refills and possible UTI Frequency, urgency, back pain, lower abdominal/suprapubic pain, and burning with urination, tried to take azo and it helped a little bit, symptoms started last week. Urinalysis positive for large amount of blood.  Reviwed labs -- normal except for cholesterol and elevated fasting glucose High cholesterol -- takes rosuvastatin 5 mg daily.  BP controlled on current medications    Current Medication: Outpatient Encounter Medications as of 04/30/2022  Medication Sig Note   amLODipine (NORVASC) 5 MG tablet TAKE 1 TABLET BY MOUTH EVERY DAY    ergocalciferol (DRISDOL) 1.25 MG (50000 UT) capsule Take 1 capsule (50,000 Units total) by mouth once a week.    fluticasone (FLONASE) 50 MCG/ACT nasal spray Place 1 spray into both nostrils.    ibuprofen (ADVIL,MOTRIN) 600 MG tablet TAKE 1 TABLET(S) BY MOUTH THREE TIMES A DAY AS NEEDED FOR PAIN 02/11/2015: Received from: External Pharmacy   rosuvastatin (CRESTOR) 10 MG tablet Take 1 tablet (10 mg total) by mouth daily.    sulfamethoxazole-trimethoprim (BACTRIM DS) 800-160 MG tablet Take 1 tablet by mouth 2 (two) times daily for 5 days. Take with food.    Zoster Vaccine Adjuvanted [facility] injection Inject 0.5 mLs into the muscle once.    [DISCONTINUED] cyclobenzaprine (FLEXERIL) 5 MG tablet Take 1 tablet (5 mg total) by mouth 2 (two) times daily as needed for muscle spasms.    [DISCONTINUED] rosuvastatin (CRESTOR) 5 MG tablet Take 1 tablet (5 mg total) by mouth daily.    [DISCONTINUED] Tdap (BOOSTRIX) 5-2.5-18.5 LF-MCG/0.5 injection Inject 0.5 mLs into the muscle once.    [DISCONTINUED]  triamterene-hydrochlorothiazide (MAXZIDE-25) 37.5-25 MG tablet Take 1 tablet by mouth daily.    [EXPIRED] Tdap (BOOSTRIX) 5-2.5-18.5 LF-MCG/0.5 injection Inject 0.5 mLs into the muscle once for 1 dose.    triamterene-hydrochlorothiazide (MAXZIDE-25) 37.5-25 MG tablet Take 1 tablet by mouth daily.    No facility-administered encounter medications on file as of 04/30/2022.    Surgical History: Past Surgical History:  Procedure Laterality Date   TUBAL LIGATION      Medical History: Past Medical History:  Diagnosis Date   Constipation    Hypertension    Pelvic pain in female    PMB (postmenopausal bleeding)    Postmenopausal bleeding 02/11/2015   Sleep apnea     Family History: Family History  Problem Relation Age of Onset   Ovarian cancer Sister    Colon cancer Brother    Asthma Maternal Aunt    Diabetes Maternal Aunt    Kidney disease Maternal Aunt    Heart disease Neg Hx     Social History   Socioeconomic History   Marital status: Married    Spouse name: Not on file   Number of children: Not on file   Years of education: Not on file   Highest education level: Not on file  Occupational History   Not on file  Tobacco Use   Smoking status: Never   Smokeless tobacco: Never  Substance and Sexual Activity   Alcohol use: Yes    Comment: occas   Drug use: No   Sexual activity: Yes    Birth control/protection: Surgical  Other Topics Concern  Not on file  Social History Narrative   Not on file   Social Determinants of Health   Financial Resource Strain: Not on file  Food Insecurity: Not on file  Transportation Needs: Not on file  Physical Activity: Not on file  Stress: Not on file  Social Connections: Not on file  Intimate Partner Violence: Not on file      Review of Systems  Constitutional: Negative.  Negative for fatigue.  HENT: Negative.    Respiratory: Negative.  Negative for cough, chest tightness, shortness of breath and wheezing.   Cardiovascular:  Negative.  Negative for chest pain and palpitations.  Gastrointestinal: Negative.  Negative for abdominal pain, constipation, diarrhea, nausea and vomiting.  Genitourinary:  Positive for dysuria, frequency, hematuria, pelvic pain and urgency.  Musculoskeletal:  Positive for back pain.    Vital Signs: BP 128/60   Pulse 92   Temp 98.4 F (36.9 C)   Resp 16   Ht 4\' 11"  (1.499 m)   Wt 147 lb 12.8 oz (67 kg)   LMP 01/09/2015 (Approximate)   SpO2 97%   BMI 29.85 kg/m    Physical Exam Vitals reviewed.  Constitutional:      General: She is not in acute distress.    Appearance: Normal appearance. She is not ill-appearing.  HENT:     Head: Normocephalic and atraumatic.  Eyes:     Pupils: Pupils are equal, round, and reactive to light.  Cardiovascular:     Rate and Rhythm: Normal rate and regular rhythm.  Pulmonary:     Effort: Pulmonary effort is normal. No respiratory distress.  Abdominal:     Tenderness: There is abdominal tenderness in the suprapubic area.  Neurological:     Mental Status: She is alert and oriented to person, place, and time.  Psychiatric:        Mood and Affect: Mood normal.        Behavior: Behavior normal.        Assessment/Plan: 1. Acute cystitis with hematuria Bactrim prescribed empirically. Urine culture sent.  - CULTURE, URINE COMPREHENSIVE - sulfamethoxazole-trimethoprim (BACTRIM DS) 800-160 MG tablet; Take 1 tablet by mouth 2 (two) times daily for 5 days. Take with food.  Dispense: 10 tablet; Refill: 0  2. Essential hypertension Continue triamterene-HCTZ as prescribed.  - triamterene-hydrochlorothiazide (MAXZIDE-25) 37.5-25 MG tablet; Take 1 tablet by mouth daily.  Dispense: 90 tablet; Refill: 1  3. Mixed hyperlipidemia Cholesterol levels have increased some, rosuvastatin dose increased to 10 mg daily.  - rosuvastatin (CRESTOR) 10 MG tablet; Take 1 tablet (10 mg total) by mouth daily.  Dispense: 90 tablet; Refill: 1  4. Dysuria Positive  for large amount of blood, negative for leukocytes and nitrites. Sent for culture.  - POCT Urinalysis Dipstick  5. Need for vaccination - Tdap ([place]) 5-2.5-18.5 LF-MCG/0.5 injection; Inject 0.5 mLs into the muscle once for 1 dose.  Dispense: 0.5 mL; Refill: 0   General Counseling: Izora Gala verbalizes understanding of the findings of todays visit and agrees with plan of treatment. I have discussed any further diagnostic evaluation that may be needed or ordered today. We also reviewed her medications today. she has been encouraged to call the office with any questions or concerns that should arise related to todays visit.    Orders Placed This Encounter  Procedures   CULTURE, URINE COMPREHENSIVE   POCT Urinalysis Dipstick    Meds ordered this encounter  Medications   Tdap (BOOSTRIX) 5-2.5-18.5 LF-MCG/0.5 injection    Sig: Inject 0.5  mLs into the muscle once for 1 dose.    Dispense:  0.5 mL    Refill:  0   triamterene-hydrochlorothiazide (MAXZIDE-25) 37.5-25 MG tablet    Sig: Take 1 tablet by mouth daily.    Dispense:  90 tablet    Refill:  1   rosuvastatin (CRESTOR) 10 MG tablet    Sig: Take 1 tablet (10 mg total) by mouth daily.    Dispense:  90 tablet    Refill:  1    Note increased dose, please discontinue 5 mg tab and fill new script asap.   sulfamethoxazole-trimethoprim (BACTRIM DS) 800-160 MG tablet    Sig: Take 1 tablet by mouth 2 (two) times daily for 5 days. Take with food.    Dispense:  10 tablet    Refill:  0    Return for previously scheduled, CPE, Crawford Tamura PCP in 6 months, otherwise as needed. .   Total time spent:30 Minutes Time spent includes review of chart, medications, test results, and follow up plan with the patient.   [place] Controlled Substance Database was reviewed by me.  This patient was seen by Jonetta Osgood, FNP-C in collaboration with Dr. Clayborn Bigness as a part of collaborative care agreement.   Ormond Lazo R. Valetta Fuller, MSN, FNP-C Internal medicine

## 2022-05-01 ENCOUNTER — Encounter: Payer: Self-pay | Admitting: Nurse Practitioner

## 2022-05-03 ENCOUNTER — Ambulatory Visit: Payer: BC Managed Care – PPO | Admitting: Nurse Practitioner

## 2022-05-04 LAB — CULTURE, URINE COMPREHENSIVE

## 2022-06-14 ENCOUNTER — Ambulatory Visit: Payer: BC Managed Care – PPO | Admitting: Physician Assistant

## 2022-06-21 DIAGNOSIS — G4733 Obstructive sleep apnea (adult) (pediatric): Secondary | ICD-10-CM | POA: Diagnosis not present

## 2022-06-28 ENCOUNTER — Ambulatory Visit (INDEPENDENT_AMBULATORY_CARE_PROVIDER_SITE_OTHER): Payer: BC Managed Care – PPO | Admitting: Physician Assistant

## 2022-06-28 ENCOUNTER — Encounter: Payer: Self-pay | Admitting: Physician Assistant

## 2022-06-28 VITALS — BP 135/90 | HR 95 | Temp 98.3°F | Resp 16 | Ht 59.0 in | Wt 146.0 lb

## 2022-06-28 DIAGNOSIS — Z7189 Other specified counseling: Secondary | ICD-10-CM | POA: Diagnosis not present

## 2022-06-28 DIAGNOSIS — G4733 Obstructive sleep apnea (adult) (pediatric): Secondary | ICD-10-CM | POA: Diagnosis not present

## 2022-06-28 DIAGNOSIS — I10 Essential (primary) hypertension: Secondary | ICD-10-CM
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
## 2022-09-23 DIAGNOSIS — G4733 Obstructive sleep apnea (adult) (pediatric): Secondary | ICD-10-CM | POA: Diagnosis not present

## 2022-10-07 ENCOUNTER — Other Ambulatory Visit: Payer: Self-pay | Admitting: Nurse Practitioner

## 2022-10-07 DIAGNOSIS — E782 Mixed hyperlipidemia: Secondary | ICD-10-CM

## 2022-11-08 ENCOUNTER — Ambulatory Visit (INDEPENDENT_AMBULATORY_CARE_PROVIDER_SITE_OTHER): Payer: BC Managed Care – PPO | Admitting: Nurse Practitioner

## 2022-11-08 ENCOUNTER — Encounter: Payer: Self-pay | Admitting: Nurse Practitioner

## 2022-11-08 VITALS — BP 158/90 | HR 84 | Temp 98.7°F | Resp 16 | Ht 59.0 in | Wt 147.6 lb

## 2022-11-08 DIAGNOSIS — Z0001 Encounter for general adult medical examination with abnormal findings: Secondary | ICD-10-CM | POA: Diagnosis not present

## 2022-11-08 DIAGNOSIS — I10 Essential (primary) hypertension: Secondary | ICD-10-CM | POA: Diagnosis not present

## 2022-11-08 DIAGNOSIS — E538 Deficiency of other specified B group vitamins: Secondary | ICD-10-CM

## 2022-11-08 DIAGNOSIS — E782 Mixed hyperlipidemia: Secondary | ICD-10-CM | POA: Diagnosis not present

## 2022-11-08 DIAGNOSIS — E559 Vitamin D deficiency, unspecified: Secondary | ICD-10-CM

## 2022-11-08 MED ORDER — AMLODIPINE BESYLATE 5 MG PO TABS: ORAL_TABLET | ORAL | 3 refills | Status: DC

## 2022-11-08 MED ORDER — TRIAMTERENE-HCTZ 37.5-25 MG PO TABS: 1.0000 | ORAL_TABLET | Freq: Every day | ORAL | 3 refills | Status: DC

## 2022-11-08 NOTE — Progress Notes (Signed)
[facility] [address] [place], [place] [address]  Internal MEDICINE  Office Visit Note  Patient Name: Barbara Cantrell  [id number]  [id number]  Date of Service: 11/08/2022  Chief Complaint  Patient presents with   Hypertension   Annual Exam    Hypertension Pertinent negatives include no chest pain, headaches, neck pain or shortness of breath.   Blaise presents for an annual well visit and physical exam.  Well-appearing 57 y.o. female with hypertension, OSA, high cholesterol and intermittent low back pain.  Routine CRC screening: due in 2032 Routine mammogram: scheduled for this month october DEXA scan done September 2022 Pap smear: due now, will have done in the next year in march Labs: hold off until march  New or worsening pain: none  Other concerns: ran out of BP medications, has headache but BP is high. BP is ok when she is on her medications.       11/08/2022    8:58 AM  Depression screen PHQ 2/9  Decreased Interest 0  Down, Depressed, Hopeless 0  PHQ - 2 Score 0     Current Medication: Outpatient Encounter Medications as of 11/08/2022  Medication Sig Note   ergocalciferol (DRISDOL) 1.25 MG (50000 UT) capsule Take 1 capsule (50,000 Units total) by mouth once a week.    fluticasone (FLONASE) 50 MCG/ACT nasal spray Place 1 spray into both nostrils.    ibuprofen (ADVIL,MOTRIN) 600 MG tablet TAKE 1 TABLET(S) BY MOUTH THREE TIMES A DAY AS NEEDED FOR PAIN 02/11/2015: Received from: External Pharmacy   rosuvastatin (CRESTOR) 10 MG tablet TAKE 1 TABLET BY MOUTH EVERY DAY    Zoster Vaccine Adjuvanted [facility]) injection Inject 0.5 mLs into the muscle once.    [DISCONTINUED] amLODipine (NORVASC) 5 MG tablet TAKE 1 TABLET BY MOUTH EVERY DAY    [DISCONTINUED] triamterene-hydrochlorothiazide (MAXZIDE-25) 37.5-25 MG tablet Take 1 tablet by mouth daily.    amLODipine (NORVASC) 5 MG tablet TAKE 1 TABLET BY MOUTH EVERY DAY    triamterene-hydrochlorothiazide  (MAXZIDE-25) 37.5-25 MG tablet Take 1 tablet by mouth daily.    No facility-administered encounter medications on file as of 11/08/2022.    Surgical History: Past Surgical History:  Procedure Laterality Date   TUBAL LIGATION      Medical History: Past Medical History:  Diagnosis Date   Constipation    Hypertension    Pelvic pain in female    PMB (postmenopausal bleeding)    Postmenopausal bleeding 02/11/2015   Sleep apnea     Family History: Family History  Problem Relation Age of Onset   Ovarian cancer Sister    Colon cancer Brother    Asthma Maternal Aunt    Diabetes Maternal Aunt    Kidney disease Maternal Aunt    Heart disease Neg Hx     Social History   Socioeconomic History   Marital status: Married    Spouse name: Not on file   Number of children: Not on file   Years of education: Not on file   Highest education level: Not on file  Occupational History   Not on file  Tobacco Use   Smoking status: Never   Smokeless tobacco: Never  Substance and Sexual Activity   Alcohol use: Yes    Comment: occas   Drug use: No   Sexual activity: Yes    Birth control/protection: Surgical  Other Topics Concern   Not on file  Social History Narrative   Not on file   Social Determinants of Health  Financial Resource Strain: Not on file  Food Insecurity: Not on file  Transportation Needs: Not on file  Physical Activity: Not on file  Stress: Not on file  Social Connections: Not on file  Intimate Partner Violence: Not on file      Review of Systems  Constitutional:  Negative for activity change, appetite change, chills, fatigue, fever and unexpected weight change.  HENT: Negative.  Negative for congestion, ear pain, rhinorrhea, sore throat and trouble swallowing.   Eyes: Negative.   Respiratory: Negative.  Negative for cough, chest tightness, shortness of breath and wheezing.   Cardiovascular: Negative.  Negative for chest pain.  Gastrointestinal: Negative.   Negative for abdominal pain, blood in stool, constipation, diarrhea, nausea and vomiting.  Endocrine: Negative.   Genitourinary: Negative.  Negative for difficulty urinating, dysuria, frequency, hematuria and urgency.  Musculoskeletal:  Positive for arthralgias and back pain. Negative for joint swelling, myalgias and neck pain.  Skin:  Negative for wound.  Allergic/Immunologic: Negative.  Negative for immunocompromised state.  Neurological: Negative.  Negative for dizziness, seizures, numbness and headaches.  Hematological: Negative.   Psychiatric/Behavioral: Negative.  Negative for behavioral problems, self-injury and suicidal ideas. The patient is not nervous/anxious.     Vital Signs: BP (!) 158/90 Comment: 160/98  Pulse 84   Temp 98.7 F (37.1 C)   Resp 16   Ht 4\' 11"  (1.499 m)   Wt 147 lb 9.6 oz (67 kg)   LMP 01/09/2015 (Approximate)   SpO2 99%   BMI 29.81 kg/m    Physical Exam Vitals reviewed.  Constitutional:      General: She is awake. She is not in acute distress.    Appearance: Normal appearance. She is well-developed and well-groomed. She is obese. She is not ill-appearing or diaphoretic.  HENT:     Head: Normocephalic and atraumatic.     Right Ear: Tympanic membrane, ear canal and external ear normal.     Left Ear: Tympanic membrane, ear canal and external ear normal.     Nose: Nose normal. No congestion or rhinorrhea.     Mouth/Throat:     Lips: Pink.     Mouth: Mucous membranes are moist.     Pharynx: Oropharynx is clear. Uvula midline. No oropharyngeal exudate or posterior oropharyngeal erythema.  Eyes:     General: Lids are normal. Vision grossly intact. Gaze aligned appropriately. No scleral icterus.       Right eye: No discharge.        Left eye: No discharge.     Extraocular Movements: Extraocular movements intact.     Conjunctiva/sclera: Conjunctivae normal.     Pupils: Pupils are equal, round, and reactive to light.     Funduscopic exam:    Right  eye: Red reflex present.        Left eye: Red reflex present. Neck:     Thyroid: No thyromegaly.     Vascular: No carotid bruit or JVD.     Trachea: Trachea and phonation normal. No tracheal deviation.  Cardiovascular:     Rate and Rhythm: Normal rate and regular rhythm.     Pulses: Normal pulses.     Heart sounds: Normal heart sounds, S1 normal and S2 normal. No murmur heard.    No friction rub. No gallop.  Pulmonary:     Effort: Pulmonary effort is normal. No accessory muscle usage or respiratory distress.     Breath sounds: Normal air entry. No stridor. No decreased breath sounds, wheezing or rales.  Chest:     Chest wall: No tenderness.  Breasts:    Breasts are symmetrical.     Right: Normal. No swelling, bleeding, inverted nipple, mass, nipple discharge, skin change or tenderness.     Left: Normal. No swelling, bleeding, inverted nipple, mass, nipple discharge, skin change or tenderness.  Abdominal:     General: Bowel sounds are normal. There is no distension.     Palpations: Abdomen is soft. There is no mass.     Tenderness: There is no abdominal tenderness. There is no guarding or rebound.  Musculoskeletal:        General: No tenderness or deformity. Normal range of motion.     Cervical back: Normal range of motion and neck supple.     Right lower leg: No edema.     Left lower leg: No edema.  Lymphadenopathy:     Cervical: No cervical adenopathy.     Upper Body:     Right upper body: No supraclavicular, axillary or pectoral adenopathy.     Left upper body: No supraclavicular, axillary or pectoral adenopathy.  Skin:    General: Skin is warm and dry.     Capillary Refill: Capillary refill takes less than 2 seconds.     Coloration: Skin is not pale.     Findings: No erythema or rash.  Neurological:     Mental Status: She is alert and oriented to person, place, and time.     Cranial Nerves: No cranial nerve deficit.     Motor: No abnormal muscle tone.     Coordination:  Coordination normal.     Gait: Gait normal.     Deep Tendon Reflexes: Reflexes are normal and symmetric.  Psychiatric:        Mood and Affect: Mood normal.        Behavior: Behavior normal. Behavior is cooperative.        Thought Content: Thought content normal.        Judgment: Judgment normal.        Assessment/Plan: 1. Encounter for routine adult health examination with abnormal findings Age-appropriate preventive screenings and vaccinations discussed, annual physical exam completed. Routine labs for health maintenance deferred until march. PHM updated.   2. Essential hypertension Medication refills ordered for amlodipine and triamterene-hydrochlorothiazide, continue as prescribed. BP is stable on medications at home.  - amLODipine (NORVASC) 5 MG tablet; TAKE 1 TABLET BY MOUTH EVERY DAY  Dispense: 90 tablet; Refill: 3 - triamterene-hydrochlorothiazide (MAXZIDE-25) 37.5-25 MG tablet; Take 1 tablet by mouth daily.  Dispense: 90 tablet; Refill: 3  3. Mixed hyperlipidemia Continue rosuvastatin as prescribed. Repeat lipid panel in march   4. B12 deficiency Will repeat lab in march   5. Vitamin D deficiency Takes weekly vitamin D supplement, continue as prescribed.       General Counseling: Lily Peer understanding of the findings of todays visit and agrees with plan of treatment. I have discussed any further diagnostic evaluation that may be needed or ordered today. We also reviewed her medications today. she has been encouraged to call the office with any questions or concerns that should arise related to todays visit.    No orders of the defined types were placed in this encounter.   Meds ordered this encounter  Medications   amLODipine (NORVASC) 5 MG tablet    Sig: TAKE 1 TABLET BY MOUTH EVERY DAY    Dispense:  90 tablet    Refill:  3   triamterene-hydrochlorothiazide (MAXZIDE-25) 37.5-25 MG tablet  Sig: Take 1 tablet by mouth daily.    Dispense:  90 tablet     Refill:  3    Return in about 6 months (around 05/08/2023) for PAP ONLY and labs , Nazia Rhines PCP.   Total time spent:30 Minutes Time spent includes review of chart, medications, test results, and follow up plan with the patient.   [place] Controlled Substance Database was reviewed by me.  This patient was seen by Sallyanne Kuster, FNP-C in collaboration with Dr. Beverely Risen as a part of collaborative care agreement.  Shantanique Hodo R. Tedd Sias, MSN, FNP-C Internal medicine

## 2022-11-15 DIAGNOSIS — Z1231 Encounter for screening mammogram for malignant neoplasm of breast: Secondary | ICD-10-CM | POA: Diagnosis not present

## 2022-12-03 DIAGNOSIS — N200 Calculus of kidney: Secondary | ICD-10-CM | POA: Diagnosis not present

## 2022-12-03 DIAGNOSIS — R350 Frequency of micturition: Secondary | ICD-10-CM | POA: Diagnosis not present

## 2022-12-15 ENCOUNTER — Ambulatory Visit: Payer: BC Managed Care – PPO

## 2022-12-22 DIAGNOSIS — G4733 Obstructive sleep apnea (adult) (pediatric): Secondary | ICD-10-CM | POA: Diagnosis not present

## 2022-12-30 ENCOUNTER — Encounter: Payer: Self-pay | Admitting: Physician Assistant

## 2022-12-30 ENCOUNTER — Ambulatory Visit: Payer: BC Managed Care – PPO | Admitting: Physician Assistant

## 2022-12-30 VITALS — BP 128/78 | HR 83 | Temp 98.0°F | Resp 16 | Ht 59.0 in | Wt 151.0 lb

## 2022-12-30 DIAGNOSIS — I10 Essential (primary) hypertension: Secondary | ICD-10-CM | POA: Diagnosis not present

## 2022-12-30 DIAGNOSIS — Z7189 Other specified counseling: Secondary | ICD-10-CM

## 2022-12-30 DIAGNOSIS — G4733 Obstructive sleep apnea (adult) (pediatric): Secondary | ICD-10-CM | POA: Diagnosis not present
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
## 2023-02-15 ENCOUNTER — Telehealth: Payer: Self-pay | Admitting: Nurse Practitioner

## 2023-02-15 NOTE — Telephone Encounter (Signed)
 Lvm & sent mychart msg to move 05/09/23 appointment-Toni

## 2023-03-22 DIAGNOSIS — G4733 Obstructive sleep apnea (adult) (pediatric): Secondary | ICD-10-CM | POA: Diagnosis not present

## 2023-04-20 ENCOUNTER — Other Ambulatory Visit: Payer: Self-pay | Admitting: Nurse Practitioner

## 2023-04-20 DIAGNOSIS — I10 Essential (primary) hypertension: Secondary | ICD-10-CM | POA: Diagnosis not present

## 2023-04-20 DIAGNOSIS — E559 Vitamin D deficiency, unspecified: Secondary | ICD-10-CM | POA: Diagnosis not present

## 2023-04-20 DIAGNOSIS — E538 Deficiency of other specified B group vitamins: Secondary | ICD-10-CM | POA: Diagnosis not present

## 2023-04-20 DIAGNOSIS — E782 Mixed hyperlipidemia: Secondary | ICD-10-CM | POA: Diagnosis not present

## 2023-04-21 LAB — COMPREHENSIVE METABOLIC PANEL
ALT: 19 IU/L (ref 0–32)
AST: 16 IU/L (ref 0–40)
Albumin: 4.4 g/dL (ref 3.8–4.9)
Alkaline Phosphatase: 71 IU/L (ref 44–121)
BUN/Creatinine Ratio: 18 (ref 9–23)
BUN: 15 mg/dL (ref 6–24)
Bilirubin Total: 0.3 mg/dL (ref 0.0–1.2)
CO2: 22 mmol/L (ref 20–29)
Calcium: 9.3 mg/dL (ref 8.7–10.2)
Chloride: 107 mmol/L — ABNORMAL HIGH (ref 96–106)
Creatinine, Ser: 0.85 mg/dL (ref 0.57–1.00)
Globulin, Total: 3 g/dL (ref 1.5–4.5)
Glucose: 106 mg/dL — ABNORMAL HIGH (ref 70–99)
Potassium: 4.7 mmol/L (ref 3.5–5.2)
Sodium: 143 mmol/L (ref 134–144)
Total Protein: 7.4 g/dL (ref 6.0–8.5)
eGFR: 79 mL/min/{1.73_m2} (ref >59)

## 2023-04-21 LAB — CBC WITH DIFFERENTIAL/PLATELET
Basophils Absolute: 0 10*3/uL (ref 0.0–0.2)
Basos: 1 %
EOS (ABSOLUTE): 0.3 10*3/uL (ref 0.0–0.4)
Eos: 4 %
Hematocrit: 39.9 % (ref 34.0–46.6)
Hemoglobin: 12.9 g/dL (ref 11.1–15.9)
Immature Grans (Abs): 0 10*3/uL (ref 0.0–0.1)
Immature Granulocytes: 0 %
Lymphocytes Absolute: 3.8 10*3/uL — ABNORMAL HIGH (ref 0.7–3.1)
Lymphs: 54 %
MCH: 27.6 pg (ref 26.6–33.0)
MCHC: 32.3 g/dL (ref 31.5–35.7)
MCV: 85 fL (ref 79–97)
Monocytes Absolute: 0.4 10*3/uL (ref 0.1–0.9)
Monocytes: 5 %
Neutrophils Absolute: 2.6 10*3/uL (ref 1.4–7.0)
Neutrophils: 36 %
Platelets: 354 10*3/uL (ref 150–450)
RBC: 4.68 x10E6/uL (ref 3.77–5.28)
RDW: 14 % (ref 11.7–15.4)
WBC: 7.1 10*3/uL (ref 3.4–10.8)

## 2023-04-21 LAB — LIPID PANEL
Chol/HDL Ratio: 5.8 ratio — ABNORMAL HIGH (ref 0.0–4.4)
Cholesterol, Total: 225 mg/dL — ABNORMAL HIGH (ref 100–199)
HDL: 39 mg/dL — ABNORMAL LOW (ref >39)
LDL Chol Calc (NIH): 143 mg/dL — ABNORMAL HIGH (ref 0–99)
Triglycerides: 234 mg/dL — ABNORMAL HIGH (ref 0–149)
VLDL Cholesterol Cal: 43 mg/dL — ABNORMAL HIGH (ref 5–40)

## 2023-04-21 LAB — B12 AND FOLATE PANEL
Folate: 9.2 ng/mL (ref >3.0)
Vitamin B-12: 390 pg/mL (ref 232–1245)

## 2023-04-21 LAB — VITAMIN D 25 HYDROXY (VIT D DEFICIENCY, FRACTURES): Vit D, 25-Hydroxy: 55.9 ng/mL (ref 30.0–100.0)

## 2023-04-28 ENCOUNTER — Encounter: Payer: Self-pay | Admitting: Nurse Practitioner

## 2023-04-28 ENCOUNTER — Ambulatory Visit: Payer: BC Managed Care – PPO | Admitting: Nurse Practitioner

## 2023-04-28 VITALS — BP 138/88 | HR 79 | Temp 98.5°F | Resp 16 | Ht 59.0 in | Wt 148.0 lb

## 2023-04-28 DIAGNOSIS — E559 Vitamin D deficiency, unspecified: Secondary | ICD-10-CM | POA: Diagnosis not present

## 2023-04-28 DIAGNOSIS — Z124 Encounter for screening for malignant neoplasm of cervix: Secondary | ICD-10-CM

## 2023-04-28 DIAGNOSIS — E538 Deficiency of other specified B group vitamins: Secondary | ICD-10-CM | POA: Diagnosis not present

## 2023-04-28 DIAGNOSIS — E782 Mixed hyperlipidemia: Secondary | ICD-10-CM

## 2023-04-28 MED ORDER — ROSUVASTATIN CALCIUM 10 MG PO TABS: 10.0000 mg | ORAL_TABLET | Freq: Every day | ORAL | 3 refills | Status: AC

## 2023-04-28 NOTE — Progress Notes (Signed)
 [facility] [address] [place], [place] [address]  Internal MEDICINE  Office Visit Note  Patient Name: Barbara Cantrell  [id number]  [id number]  Date of Service: 04/28/2023  Chief Complaint  Patient presents with  . Follow-up    Pap only     HPI Barbara Cantrell presents for a follow-up visit for high cholesterol, low B12, low vitamin D, and routine pap smear. High cholesterol -- has not had the rosuvastatin for the past couple of months. Was not filled by the pharmacy Low normal B12  Routine pap smear and pelvic exam Low vitamin D -- has now been resolved.    Current Medication: Outpatient Encounter Medications as of 04/28/2023  Medication Sig Note  . amLODipine (NORVASC) 5 MG tablet TAKE 1 TABLET BY MOUTH EVERY DAY   . ergocalciferol (DRISDOL) 1.25 MG (50000 UT) capsule Take 1 capsule (50,000 Units total) by mouth once a week.   . fluticasone (FLONASE) 50 MCG/ACT nasal spray Place 1 spray into both nostrils.   Marland Kitchen ibuprofen (ADVIL,MOTRIN) 600 MG tablet TAKE 1 TABLET(S) BY MOUTH THREE TIMES A DAY AS NEEDED FOR PAIN 02/11/2015: Received from: External Pharmacy  . triamterene-hydrochlorothiazide (MAXZIDE-25) 37.5-25 MG tablet Take 1 tablet by mouth daily.   Marland Kitchen Zoster Vaccine Adjuvanted [facility]) injection Inject 0.5 mLs into the muscle once.   . [DISCONTINUED] rosuvastatin (CRESTOR) 10 MG tablet TAKE 1 TABLET BY MOUTH EVERY DAY   . rosuvastatin (CRESTOR) 10 MG tablet Take 1 tablet (10 mg total) by mouth daily.    No facility-administered encounter medications on file as of 04/28/2023.    Surgical History: Past Surgical History:  Procedure Laterality Date  . TUBAL LIGATION      Medical History: Past Medical History:  Diagnosis Date  . Constipation   . Hypertension   . Pelvic pain in female   . PMB (postmenopausal bleeding)   . Postmenopausal bleeding 02/11/2015  . Sleep apnea     Family History: Family History  Problem Relation Age of Onset  . Ovarian cancer Sister    . Colon cancer Brother   . Asthma Maternal Aunt   . Diabetes Maternal Aunt   . Kidney disease Maternal Aunt   . Heart disease Neg Hx     Social History   Socioeconomic History  . Marital status: Married    Spouse name: Not on file  . Number of children: Not on file  . Years of education: Not on file  . Highest education level: Not on file  Occupational History  . Not on file  Tobacco Use  . Smoking status: Never  . Smokeless tobacco: Never  Substance and Sexual Activity  . Alcohol use: Yes    Comment: occas  . Drug use: No  . Sexual activity: Yes    Birth control/protection: Surgical  Other Topics Concern  . Not on file  Social History Narrative  . Not on file   Social Drivers of Health   Financial Resource Strain: Not on file  Food Insecurity: Not on file  Transportation Needs: Not on file  Physical Activity: Not on file  Stress: Not on file  Social Connections: Not on file  Intimate Partner Violence: Not on file      Review of Systems  Constitutional:  Positive for fatigue.  HENT: Negative.    Respiratory: Negative.  Negative for cough, chest tightness, shortness of breath and wheezing.   Cardiovascular: Negative.  Negative for chest pain and palpitations.  Gastrointestinal: Negative.   Genitourinary:  Negative for dysuria, menstrual problem, pelvic pain, vaginal bleeding, vaginal discharge and vaginal pain.  Musculoskeletal: Negative.     Vital Signs: BP 138/88   Pulse 79   Temp 98.5 F (36.9 C)   Resp 16   Ht 4\' 11"  (1.499 m)   Wt 148 lb (67.1 kg)   LMP 01/09/2015 (Approximate)   SpO2 97%   BMI 29.89 kg/m    Physical Exam Vitals reviewed.  Constitutional:      General: She is not in acute distress.    Appearance: Normal appearance. She is not ill-appearing.  HENT:     Head: Normocephalic and atraumatic.  Eyes:     Pupils: Pupils are equal, round, and reactive to light.  Cardiovascular:     Rate and Rhythm: Normal rate and regular  rhythm.  Pulmonary:     Effort: Pulmonary effort is normal. No respiratory distress.  Abdominal:     Hernia: There is no hernia in the left inguinal area or right inguinal area.  Genitourinary:    General: Normal vulva.     Exam position: Lithotomy position.     Pubic Area: No rash or pubic lice.      Labia:        Right: No rash, tenderness, lesion or injury.        Left: No rash, tenderness, lesion or injury.      Urethra: No prolapse, urethral pain, urethral swelling or urethral lesion.     Cervix: Normal. No cervical motion tenderness, discharge, friability, lesion, erythema, cervical bleeding or eversion.     Uterus: Normal.      Adnexa: Right adnexa normal and left adnexa normal.     Rectum: Normal. No mass, tenderness, anal fissure or external hemorrhoid.  Lymphadenopathy:     Lower Body: No right inguinal adenopathy. No left inguinal adenopathy.  Neurological:     Mental Status: She is alert and oriented to person, place, and time.  Psychiatric:        Mood and Affect: Mood normal.        Behavior: Behavior normal.       Assessment/Plan: 1. Mixed hyperlipidemia (Primary) Continue rosuvastatin as prescribed, refills ordered.  - rosuvastatin (CRESTOR) 10 MG tablet; Take 1 tablet (10 mg total) by mouth daily.  Dispense: 90 tablet; Refill: 3  2. B12 deficiency Recommend OTC B12 supplement  3. Vitamin D deficiency Resolved. May continue OTC supplement if desired.   4. Routine Papanicolaou smear Normal pelvic exam and pap smear done . - IGP, Aptima HPV   General Counseling: Lily Peer understanding of the findings of todays visit and agrees with plan of treatment. I have discussed any further diagnostic evaluation that may be needed or ordered today. We also reviewed her medications today. she has been encouraged to call the office with any questions or concerns that should arise related to todays visit.    No orders of the defined types were placed in this  encounter.   Meds ordered this encounter  Medications  . rosuvastatin (CRESTOR) 10 MG tablet    Sig: Take 1 tablet (10 mg total) by mouth daily.    Dispense:  90 tablet    Refill:  3    Please fill this medication asap, patient has been out for 2 months.    Return in about 6 months (around 10/29/2023) for F/U, Munirah Doerner PCP.   Total time spent:30 Minutes Time spent includes review of chart, medications, test results, and follow up plan with the  patient.   [place] Controlled Substance Database was reviewed by me.  This patient was seen by Sallyanne Kuster, FNP-C in collaboration with Dr. Beverely Risen as a part of collaborative care agreement.   Amran Malter R. Tedd Sias, MSN, FNP-C Internal medicine

## 2023-04-29 ENCOUNTER — Encounter: Payer: Self-pay | Admitting: Nurse Practitioner

## 2023-05-09 ENCOUNTER — Ambulatory Visit: Payer: BC Managed Care – PPO | Admitting: Nurse Practitioner

## 2023-05-11 ENCOUNTER — Other Ambulatory Visit: Payer: Self-pay | Admitting: Nurse Practitioner

## 2023-05-11 DIAGNOSIS — R87610 Atypical squamous cells of undetermined significance on cytologic smear of cervix (ASC-US): Secondary | ICD-10-CM

## 2023-05-11 DIAGNOSIS — R87810 Cervical high risk human papillomavirus (HPV) DNA test positive: Secondary | ICD-10-CM

## 2023-05-11 LAB — IGP, APTIMA HPV: HPV Aptima: POSITIVE — AB

## 2023-05-11 NOTE — Progress Notes (Signed)
 Please call the patient and let her know that her pap smear was abnormal -- atypical squamous cells, high grade cannot be ruled out and she was also HPV positive. Colposcopy is recommended which is a cervical biopsy done by a gynecologist. I have sent the referral out today. This recommendation is based off of the ASCCP guidelines.

## 2023-05-12 ENCOUNTER — Telehealth: Payer: Self-pay

## 2023-05-12 NOTE — Telephone Encounter (Signed)
-----   Message from [place] sent at 05/11/2023  1:54 PM EDT ----- Please call the patient and let her know that her pap smear was abnormal -- atypical squamous cells, high grade cannot be ruled out and she was also HPV positive. Colposcopy is recommended which is a cervical biopsy done by a gynecologist. I have sent the referral out today. This recommendation is based off of the ASCCP guidelines.

## 2023-05-12 NOTE — Telephone Encounter (Signed)
 Patient notified and already has an appointment.

## 2023-05-20 DIAGNOSIS — N341 Nonspecific urethritis: Secondary | ICD-10-CM | POA: Diagnosis not present

## 2023-05-20 DIAGNOSIS — N23 Unspecified renal colic: Secondary | ICD-10-CM | POA: Diagnosis not present

## 2023-05-20 DIAGNOSIS — R300 Dysuria: Secondary | ICD-10-CM | POA: Diagnosis not present

## 2023-06-20 DIAGNOSIS — G4733 Obstructive sleep apnea (adult) (pediatric): Secondary | ICD-10-CM | POA: Diagnosis not present

## 2023-06-21 ENCOUNTER — Ambulatory Visit (INDEPENDENT_AMBULATORY_CARE_PROVIDER_SITE_OTHER): Admitting: Obstetrics & Gynecology

## 2023-06-21 ENCOUNTER — Other Ambulatory Visit (HOSPITAL_COMMUNITY)
Admission: RE | Admit: 2023-06-21 | Discharge: 2023-06-21 | Disposition: A | Discharge status: Home / Self Care | Source: Ambulatory Visit | Attending: Obstetrics & Gynecology | Admitting: Obstetrics & Gynecology

## 2023-06-21 VITALS — BP 143/90 | HR 92 | Ht 59.0 in | Wt 149.9 lb

## 2023-06-21 DIAGNOSIS — Z800 Family history of malignant neoplasm of digestive organs: Secondary | ICD-10-CM | POA: Diagnosis not present

## 2023-06-21 DIAGNOSIS — N871 Moderate cervical dysplasia: Secondary | ICD-10-CM | POA: Diagnosis not present

## 2023-06-21 DIAGNOSIS — R87611 Atypical squamous cells cannot exclude high grade squamous intraepithelial lesion on cytologic smear of cervix (ASC-H): Secondary | ICD-10-CM

## 2023-06-21 DIAGNOSIS — B977 Papillomavirus as the cause of diseases classified elsewhere: Secondary | ICD-10-CM | POA: Insufficient documentation

## 2023-06-21 DIAGNOSIS — N870 Mild cervical dysplasia: Secondary | ICD-10-CM | POA: Diagnosis not present

## 2023-06-21 DIAGNOSIS — Z8041 Family history of malignant neoplasm of ovary: Secondary | ICD-10-CM

## 2023-06-21 DIAGNOSIS — Z803 Family history of malignant neoplasm of breast: Secondary | ICD-10-CM

## 2023-06-21 NOTE — Progress Notes (Signed)
    GYNECOLOGY PROGRESS NOTE  Subjective:    Patient ID: Barbara Cantrell, female    DOB: Jul 19, 1965, 58 y.o.   MRN: [id number]  HPI  Patient is a 58 y.o. [id number] separated P2 here as a new patient for a colpo due to ASCUS, cannot rule out HGSIL with + HR HPV pap. She reports a previous [place] in [place] in the distant past.  She reports a FH of breast cancer in her maternal GM, ovarian cancer in her sister and colon cancer in her brother.  The following portions of the patient's history were reviewed and updated as appropriate: allergies, current medications, past family history, past medical history, past social history, past surgical history, and problem list.  Review of Systems Pertinent items are noted in HPI.   Objective:   Blood pressure (!) 143/90, pulse 92, height 4\' 11"  (1.499 m), weight 149 lb 14.4 oz (68 kg), last menstrual period 01/09/2015. Body mass index is 30.28 kg/m. General appearance: alert Consent signed, time out done Speculum placed. Cervix prepped with acetic acid. Transformation zone seen in its entirety. Colpo adequate. Colposcopic findings normal with there was a small area of very pale ectocervix (not exactly acetowhite changes) at the 12 o'clock area. I did a biopsy of this area and used silver nitrate to achieve hemostasis  ECC obtained. She tolerated the procedure well.   Assessment:   1. Pap smear of cervix with ASCUS, cannot exclude HGSIL   2. HPV (human papilloma virus) infection   3. Family history of breast cancer   4. Family history of colon cancer   5. Family history of ovarian cancer      Plan:   1. Pap smear of cervix with ASCUS, cannot exclude HGSIL (Primary)  - Surgical pathology - I discussed that she may need a LEEP if ECC is abnormal or ectocervix biopsy is HGSIL.  2. HPV (human papilloma virus) infection  - Surgical pathology  3. Family history of breast cancer  - Integrated [facility] ([facility])  4. Family history of colon cancer  - Integrated [facility] ([facility])  5. Family history of ovarian cancer  - Integrated [facility])

## 2023-06-24 ENCOUNTER — Encounter: Payer: Self-pay | Admitting: Obstetrics & Gynecology

## 2023-06-24 LAB — SURGICAL PATHOLOGY

## 2023-06-24 NOTE — Telephone Encounter (Signed)
 Reached out to pt to schedule LEEP procedure.  Pt was at work and needed to call back to schedule.

## 2023-06-30 ENCOUNTER — Ambulatory Visit: Payer: BC Managed Care – PPO | Admitting: Physician Assistant

## 2023-06-30 ENCOUNTER — Encounter: Payer: Self-pay | Admitting: Physician Assistant

## 2023-06-30 VITALS — BP 130/80 | HR 81 | Temp 97.8°F | Resp 16 | Ht 59.0 in | Wt 149.0 lb

## 2023-06-30 DIAGNOSIS — G4733 Obstructive sleep apnea (adult) (pediatric): Secondary | ICD-10-CM | POA: Diagnosis not present

## 2023-06-30 DIAGNOSIS — I10 Essential (primary) hypertension: Secondary | ICD-10-CM | POA: Diagnosis not present

## 2023-06-30 DIAGNOSIS — Z7189 Other specified counseling: Secondary | ICD-10-CM

## 2023-06-30 NOTE — Progress Notes (Signed)
 [facility] [address] [place], [place] [address]  Pulmonary Sleep Medicine   Office Visit Note  Patient Name: Barbara Cantrell DOB: [birth year] MRN [id number]  Date of Service: 06/30/2023  Complaints/HPI: Pt is here for pulmonary follow up for OSA on CPAP. Pt is using and benefiting from PAP use. She is changing supplies regularly. Uses AHP. Denies headaches, dryness, or SOB. States she is doing well with her CPAP and has compliant download.  CPAP compliance download 05/30/23-06/28/23 Usage 26/30 days >4 hours 77% Avg usage 5 hrs Settings CPAP 8cm H2O AHI 0.7 95th leak 6.5   ROS  General: (-) fever, (-) chills, (-) night sweats, (-) weakness Skin: (-) rashes, (-) itching,. Eyes: (-) visual changes, (-) redness, (-) itching. Nose and Sinuses: (-) nasal stuffiness or itchiness, (-) postnasal drip, (-) nosebleeds, (-) sinus trouble. Mouth and Throat: (-) sore throat, (-) hoarseness. Neck: (-) swollen glands, (-) enlarged thyroid , (-) neck pain. Respiratory: - cough, (-) bloody sputum, - shortness of breath, - wheezing. Cardiovascular: - ankle swelling, (-) chest pain. Lymphatic: (-) lymph node enlargement. Neurologic: (-) numbness, (-) tingling. Psychiatric: (-) anxiety, (-) depression   Current Medication: Outpatient Encounter Medications as of 06/30/2023  Medication Sig Note   amLODipine  (NORVASC ) 5 MG tablet TAKE 1 TABLET BY MOUTH EVERY DAY    ergocalciferol  (DRISDOL ) 1.25 MG (50000 UT) capsule Take 1 capsule (50,000 Units total) by mouth once a week.    fluticasone (FLONASE) 50 MCG/ACT nasal spray Place 1 spray into both nostrils.    ibuprofen (ADVIL,MOTRIN) 600 MG tablet  02/11/2015: Received from: External Pharmacy   rosuvastatin  (CRESTOR ) 10 MG tablet Take 1 tablet (10 mg total) by mouth daily.    triamterene -hydrochlorothiazide (MAXZIDE-25) 37.5-25 MG tablet Take 1 tablet by mouth daily.    Zoster Vaccine Adjuvanted [facility]) injection Inject 0.5  mLs into the muscle once.    No facility-administered encounter medications on file as of 06/30/2023.    Surgical History: Past Surgical History:  Procedure Laterality Date   TUBAL LIGATION      Medical History: Past Medical History:  Diagnosis Date   Constipation    Hypertension    Pelvic pain in female    PMB (postmenopausal bleeding)    Postmenopausal bleeding 02/11/2015   Sleep apnea     Family History: Family History  Problem Relation Age of Onset   Ovarian cancer Sister        30s   Colon cancer Brother        71s   Asthma Maternal Aunt    Diabetes Maternal Aunt    Kidney disease Maternal Aunt    Breast cancer Maternal Aunt        late years   Heart disease Neg Hx     Social History: Social History   Socioeconomic History   Marital status: Married    Spouse name: Not on file   Number of children: Not on file   Years of education: Not on file   Highest education level: Not on file  Occupational History   Not on file  Tobacco Use   Smoking status: Never   Smokeless tobacco: Never  Substance and Sexual Activity   Alcohol use: Yes    Comment: occas   Drug use: No   Sexual activity: Yes    Birth control/protection: Surgical  Other Topics Concern   Not on file  Social History Narrative   Not on file   Social Drivers of Health  Financial Resource Strain: Not on file  Food Insecurity: Not on file  Transportation Needs: Not on file  Physical Activity: Not on file  Stress: Not on file  Social Connections: Not on file  Intimate Partner Violence: Not on file    Vital Signs: Blood pressure 130/80, pulse 81, temperature 97.8 F (36.6 C), resp. rate 16, height 4\' 11"  (1.499 m), weight 149 lb (67.6 kg), last menstrual period 01/09/2015, SpO2 98%.  Examination: General Appearance: The patient is well-developed, well-nourished, and in no distress. Skin: Gross inspection of skin unremarkable. Head: normocephalic, no gross deformities. Eyes: no gross  deformities noted. ENT: ears appear grossly normal no exudates. Neck: Supple. No thyromegaly. No LAD. Respiratory: Lungs clear to auscultation. Cardiovascular: Normal S1 and S2 without murmur or rub. Extremities: No cyanosis. pulses are equal. Neurologic: Alert and oriented. No involuntary movements.  LABS: Recent Results (from the past 2160 hours)  CBC with Differential/Platelet     Status: Abnormal   Collection Time: 04/20/23 10:53 AM  Result Value Ref Range   WBC 7.1 3.4 - 10.8 x10E3/uL   RBC 4.68 3.77 - 5.28 x10E6/uL   Hemoglobin 12.9 11.1 - 15.9 g/dL   Hematocrit 16.1 09.6 - 46.6 %   MCV 85 79 - 97 fL   MCH 27.6 26.6 - 33.0 pg   MCHC 32.3 31.5 - 35.7 g/dL   RDW 04.5 40.9 - 81.1 %   Platelets 354 150 - 450 x10E3/uL   Neutrophils 36 Not Estab. %   Lymphs 54 Not Estab. %   Monocytes 5 Not Estab. %   Eos 4 Not Estab. %   Basos 1 Not Estab. %   Neutrophils Absolute 2.6 1.4 - 7.0 x10E3/uL   Lymphocytes Absolute 3.8 (H) 0.7 - 3.1 x10E3/uL   Monocytes Absolute 0.4 0.1 - 0.9 x10E3/uL   EOS (ABSOLUTE) 0.3 0.0 - 0.4 x10E3/uL   Basophils Absolute 0.0 0.0 - 0.2 x10E3/uL   Immature Granulocytes 0 Not Estab. %   Immature Grans (Abs) 0.0 0.0 - 0.1 x10E3/uL  Comprehensive metabolic panel     Status: Abnormal   Collection Time: 04/20/23 10:53 AM  Result Value Ref Range   Glucose 106 (H) 70 - 99 mg/dL   BUN 15 6 - 24 mg/dL   Creatinine, Ser 9.14 0.57 - 1.00 mg/dL   eGFR 79 >78 GN/FAO/1.30   BUN/Creatinine Ratio 18 9 - 23   Sodium 143 134 - 144 mmol/L   Potassium 4.7 3.5 - 5.2 mmol/L   Chloride 107 (H) 96 - 106 mmol/L   CO2 22 20 - 29 mmol/L   Calcium  9.3 8.7 - 10.2 mg/dL   Total Protein 7.4 6.0 - 8.5 g/dL   Albumin 4.4 3.8 - 4.9 g/dL   Globulin, Total 3.0 1.5 - 4.5 g/dL   Bilirubin Total 0.3 0.0 - 1.2 mg/dL   Alkaline Phosphatase 71 44 - 121 IU/L   AST 16 0 - 40 IU/L   ALT 19 0 - 32 IU/L  Lipid panel     Status: Abnormal   Collection Time: 04/20/23 10:53 AM  Result Value Ref  Range   Cholesterol, Total 225 (H) 100 - 199 mg/dL   Triglycerides 865 (H) 0 - 149 mg/dL   HDL 39 (L) >78 mg/dL   VLDL Cholesterol Cal 43 (H) 5 - 40 mg/dL   LDL Chol Calc (NIH) 469 (H) 0 - 99 mg/dL   Chol/HDL Ratio 5.8 (H) 0.0 - 4.4 ratio    Comment:  T. Chol/HDL Ratio                                             Men  Women                               1/2 Avg.Risk  3.4    3.3                                   Avg.Risk  5.0    4.4                                2X Avg.Risk  9.6    7.1                                3X Avg.Risk 23.4   11.0   B12 and Folate Panel     Status: None   Collection Time: 04/20/23 10:53 AM  Result Value Ref Range   Vitamin B-12 390 232 - 1,245 pg/mL   Folate 9.2 >3.0 ng/mL    Comment: A serum folate concentration of less than 3.1 ng/mL is considered to represent clinical deficiency.   VITAMIN D  25 Hydroxy (Vit-D Deficiency, Fractures)     Status: None   Collection Time: 04/20/23 10:53 AM  Result Value Ref Range   Vit D, 25-Hydroxy 55.9 30.0 - 100.0 ng/mL    Comment: Vitamin D  deficiency has been defined by the Institute of Medicine and an Endocrine Society practice guideline as a level of serum 25-OH vitamin D  less than 20 ng/mL (1,2). The Endocrine Society went on to further define vitamin D  insufficiency as a level between 21 and 29 ng/mL (2). 1. IOM (Institute of Medicine). 2010. Dietary reference    intakes for calcium  and D. [place]: The    [employer]. 2. Holick MF, Binkley [place], Bischoff-Ferrari HA, et al.    Evaluation, treatment, and prevention of vitamin D     deficiency: an Endocrine Society clinical practice    guideline. JCEM. 2011 Jul; 96(7):1911-30.   IGP, Aptima HPV     Status: Abnormal   Collection Time: 04/28/23 10:25 AM  Result Value Ref Range   Interpretation EPCA,ASCHB (A)     Comment: EPITHELIAL CELL ABNORMALITY. ATYPICAL SQUAMOUS CELLS, CANNOT EXCLUDE HIGH-GRADE SQUAMOUS  INTRAEPITHELIAL LESION (ASC-H).    Category ASC-H (A)     Comment: Atypical Squamous Cells; Cannot Rule Out High-Grade   Adequacy ENDO     Comment: Satisfactory for evaluation. Endocervical and/or squamous metaplastic cells (endocervical component) are present.    Clinician Provided ICD10 Comment     Comment: Z12.4   Performed by: Comment     Comment: Kelby Patches, Cytotechnologist (ASCP)   Electronically signed by: Comment     Comment: Rosalene Colon, MD, Pathologist   PATHOLOGIST PROVIDED ICD10: Comment     Comment: W09.811   Note: Comment     Comment: The Pap smear is a screening test designed to aid in the detection of premalignant and malignant conditions of the uterine cervix.  It is not a diagnostic procedure and should not be used as  the sole means of detecting cervical cancer.  Both false-positive and false-negative reports do occur.    Test Methodology Comment     Comment: This liquid based ThinPrep(R) pap test was interpreted using the Hologic(R) Genius(TM) Cervical AI Algorithm whole slide imaging system.    HPV Aptima Positive (A) Negative    Comment: This nucleic acid amplification test detects fourteen high-risk HPV types (16,18,31,33,35,39,45,51,52,56,58,59,66,68) without differentiation.   Surgical pathology     Status: None   Collection Time: 06/21/23  9:20 AM  Result Value Ref Range   SURGICAL PATHOLOGY      SURGICAL PATHOLOGY CASE: [id number] PATIENT: [facility] Surgical Pathology Report     Clinical History: ASCUS, cannot exclude HGSIL, HPV infection (cm)     FINAL MICROSCOPIC DIAGNOSIS:  A. 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  B. ENDOCERVIX, CURETTAGE:      Endocervical mucosa without significant diagnostic alteration.      Negative for hyperplasia, atypia or  malignancy.  COMMENT:  Immunohistochemical stains for p16 were performed on both parts. The stain shows focal block like pattern of staining on part A confirming the presence of focal high-grade squamous intraepithelial lesion.  Controls worked appropriately.   GROSS DESCRIPTION:  A.  Received in formalin are tan, soft tissue fragments that are submitted in toto. Number: 2 size: 0.1 and 0.2 cm blocks: 1  B.  Received in formalin is blood tinged mucus that is  entirely submitted in one block.  Volume: 1.0 x 0.5 x 0.1 cm (1 B) (KW, 06/22/2023)  Final Diagnosis performed by Dillard Frame, MD.   Electronically signed 06/24/2023 Technical component performed at [facility]. [facility], [address] [place], [place] [address].  Professional component performed at [facility]. [address], [place], [place] [address]  Immunohistochemistry Technical component (if applicable) was performed at [employer]. [address], [address], [place], [place] [address].  IMMUNOHISTOCHEMISTRY DISCLAIMER (if applicable): Some of these immunohistochemical stains may have been developed and the performance characteristics determine by [facility]. Some may not have been cleared or approved by the U.S. Food and Drug Administration. The FDA has determined that such clearance or approval is not necessary. This test is used for clinical purposes. It should not be regarded as investigation al or for research. This laboratory is certified under the Clinical Laboratory Improvement Amendments of 1988 (CLIA-88) as qualified to perform high complexity clinical laboratory testing.  The controls stained appropriately.     Radiology: No results found.  No results found.  No results found.    Assessment and Plan: Patient Active Problem List   Diagnosis Date Noted   Muscle cramps 11/17/2019   Urinary tract infection without hematuria  11/17/2019   Encounter for general adult medical examination with abnormal findings 10/17/2019   Dysuria 10/17/2019   OSA (obstructive sleep apnea) 03/29/2019   Acute right-sided low back pain with right-sided sciatica 11/14/2018   Loud snoring 11/14/2018   Excessive daytime sleepiness 11/14/2018   Screening for colon cancer 11/14/2018   Essential hypertension 03/28/2017   Mixed hyperlipidemia 03/28/2017   Vitamin D  deficiency 03/28/2017   Routine cervical smear 03/28/2017   Postmenopausal bleeding 02/11/2015   Family history of breast cancer 02/11/2015   Family history of ovarian cancer 02/11/2015   Family history of colon cancer 02/11/2015    1. OSA on CPAP (Primary)  Continue nightly use  2. CPAP use counseling CPAP couseling-Discussed importance of adequate CPAP use as well as proper care and cleaning techniques of machine and all supplies.  3. Essential hypertension Continue current medication and f/u with PCP.    General Counseling: I have discussed the findings of the evaluation and examination with Haskell Linker.  I have also discussed any further diagnostic evaluation thatmay be needed or ordered today. Cana verbalizes understanding of the findings of todays visit. We also reviewed her medications today and discussed drug interactions and side effects including but not limited excessive drowsiness and altered mental states. We also discussed that there is always a risk not just to her but also people around her. she has been encouraged to call the office with any questions or concerns that should arise related to todays visit.  No orders of the defined types were placed in this encounter.    Time spent: 30  I have personally obtained a history, examined the patient, evaluated laboratory and imaging results, formulated the assessment and plan and placed orders. This patient was seen by Taylor Favia, PA-C in collaboration with Dr. Cam Cava as a part of collaborative care  agreement.     Cordie Deters, MD [facility] Pulmonary and Critical Care Sleep medicine

## 2023-07-10 DIAGNOSIS — Z803 Family history of malignant neoplasm of breast: Secondary | ICD-10-CM

## 2023-07-10 DIAGNOSIS — Z1371 Encounter for nonprocreative screening for genetic disease carrier status: Secondary | ICD-10-CM

## 2023-07-10 HISTORY — DX: Family history of malignant neoplasm of breast: Z80.3

## 2023-07-10 HISTORY — DX: Encounter for nonprocreative screening for genetic disease carrier status: Z13.71

## 2023-07-18 ENCOUNTER — Other Ambulatory Visit (HOSPITAL_COMMUNITY)
Admission: RE | Admit: 2023-07-18 | Discharge: 2023-07-18 | Disposition: A | Discharge status: Home / Self Care | Source: Ambulatory Visit | Attending: Obstetrics & Gynecology | Admitting: Obstetrics & Gynecology

## 2023-07-18 ENCOUNTER — Ambulatory Visit (INDEPENDENT_AMBULATORY_CARE_PROVIDER_SITE_OTHER): Admitting: Obstetrics & Gynecology

## 2023-07-18 VITALS — BP 135/78 | HR 81 | Ht 59.0 in | Wt 151.3 lb

## 2023-07-18 DIAGNOSIS — N871 Moderate cervical dysplasia: Secondary | ICD-10-CM | POA: Diagnosis not present

## 2023-07-18 DIAGNOSIS — N72 Inflammatory disease of cervix uteri: Secondary | ICD-10-CM | POA: Diagnosis not present

## 2023-07-18 NOTE — Progress Notes (Signed)
    GYNECOLOGY PROGRESS NOTE  Subjective:    Patient ID: Barbara Cantrell, female    DOB: 18-Sep-1965, 58 y.o.   MRN: [id number]  HPI  Patient is a 58 y.o. [id number] here for LEEP. She had a AScus cannot rule out HGSIL. 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  The following portions of the patient's history were reviewed and updated as appropriate: allergies, current medications, past family history, past medical history, past social history, past surgical history, and problem list.  Review of Systems Pertinent items are noted in HPI.  She is from [place]. [facility].  Objective:   Blood pressure 135/78, pulse 81, height 4\' 11"  (1.499 m), weight 151 lb 4.8 oz (68.6 kg), last menstrual period 01/09/2015. Body mass index is 30.56 kg/m.  Colpo Biopsy:   Risks, benefits, alternatives, and limitations of procedure explained to patient, including pain, bleeding, infection, failure to remove abnormal tissue and failure to cure dysplasia, need for repeat procedures, damage to pelvic organs, cervical incompetence.  Role of HPV,cervical dysplasia and need for close followup was empasized. Informed written consent was obtained. All questions were answered. Time out performed. Urine pregnancy test was negative.  ??Procedure: The patient was placed in lithotomy position and the bivalved coated speculum was placed in the patient's vagina. A grounding pad placed on the patient. Acetic acid was applied to the cervix and all appeared normal. I sprayed Hurricaine spray.  Local anesthesia was administered via an intracervical block using 15cc of 2% Lidocaine with epinephrine. The suction was turned on and the medium semicircular LEEP tip on 50 Watts of cutting current and cautery was used to excise the entire transformation zone and any areas of visible dysplasia. I obtained an ECC.  Excellent hemostasis was achieved using roller ball coagulation set at 50 Watts coagulation current. As per my  usual, I coated the cone bed with Monsel's. The speculum was removed from the vagina. Specimens were sent to pathology.  ?The patient tolerated the procedure well. Post-operative instructions given to patient, including instruction to seek medical attention for persistent bright red bleeding, fever, abdominal/pelvic pain, dysuria, nausea or vomiting. She was also told about the possibility of having copious yellow to black tinged discharge for weeks. She was counseled to avoid anything in the vagina (sex/douching/tampons) for 3 weeks. She has a 4 week post-operative check to assess wound healing, review results and discuss further management.     Assessment:   CIN 2  Plan:   Await pathology results

## 2023-07-19 ENCOUNTER — Encounter: Payer: Self-pay | Admitting: Obstetrics and Gynecology

## 2023-07-21 LAB — SURGICAL PATHOLOGY

## 2023-07-22 ENCOUNTER — Encounter: Payer: Self-pay | Admitting: Obstetrics & Gynecology
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
## 2023-09-20 DIAGNOSIS — G4733 Obstructive sleep apnea (adult) (pediatric): Secondary | ICD-10-CM | POA: Diagnosis not present

## 2023-10-19 ENCOUNTER — Telehealth: Payer: Self-pay | Admitting: Internal Medicine

## 2023-10-19 NOTE — Telephone Encounter (Signed)
 Lvm to move 12/29/23 pulmonary appointment to dsk schedule-Toni

## 2023-11-02 ENCOUNTER — Telehealth: Payer: Self-pay | Admitting: Nurse Practitioner

## 2023-11-02 NOTE — Telephone Encounter (Signed)
 Left vm and sent mychart message to confirm 11/09/23 appointment-Toni

## 2023-11-09 ENCOUNTER — Ambulatory Visit (INDEPENDENT_AMBULATORY_CARE_PROVIDER_SITE_OTHER): Payer: BC Managed Care – PPO | Admitting: Nurse Practitioner

## 2023-11-09 ENCOUNTER — Encounter: Payer: Self-pay | Admitting: Nurse Practitioner

## 2023-11-09 VITALS — BP 125/78 | HR 70 | Temp 98.3°F | Resp 16 | Ht 59.0 in | Wt 148.2 lb

## 2023-11-09 DIAGNOSIS — I10 Essential (primary) hypertension: Secondary | ICD-10-CM | POA: Diagnosis not present

## 2023-11-09 DIAGNOSIS — Z1231 Encounter for screening mammogram for malignant neoplasm of breast: Secondary | ICD-10-CM | POA: Diagnosis not present

## 2023-11-09 DIAGNOSIS — R7301 Impaired fasting glucose: Secondary | ICD-10-CM

## 2023-11-09 DIAGNOSIS — Z0001 Encounter for general adult medical examination with abnormal findings: Secondary | ICD-10-CM

## 2023-11-09 DIAGNOSIS — E782 Mixed hyperlipidemia: Secondary | ICD-10-CM | POA: Diagnosis not present

## 2023-11-09 NOTE — Progress Notes (Signed)
 [facility] [address] [place], [place] [address]  Internal MEDICINE  Office Visit Note  Patient Name: Barbara Cantrell  [id number]  [id number]  Date of Service: 11/09/2023  Chief Complaint  Patient presents with   Hypertension   Annual Exam    HPI Indra presents for an annual well visit and physical exam.  Well-appearing 58 y.o. female with hypertension, OSA, high cholesterol and intermittent low back pain.  Routine CRC screening: due next year  Routine mammogram: due this month  DEXA scan: due in 6 more years  Pap smear: due in December  Labs: due in march  New or worsening pain: none  Other concerns: none  Getting flu shot on Tuesday next week at work. 11/14/2023 Make sure to check A1c with labs in march.    Current Medication: Outpatient Encounter Medications as of 11/09/2023  Medication Sig Note   amLODipine  (NORVASC ) 5 MG tablet TAKE 1 TABLET BY MOUTH EVERY DAY    fluticasone (FLONASE) 50 MCG/ACT nasal spray Place 1 spray into both nostrils.    ibuprofen (ADVIL,MOTRIN) 600 MG tablet  02/11/2015: Received from: External Pharmacy   rosuvastatin  (CRESTOR ) 10 MG tablet Take 1 tablet (10 mg total) by mouth daily.    triamterene -hydrochlorothiazide (MAXZIDE-25) 37.5-25 MG tablet Take 1 tablet by mouth daily.    Zoster Vaccine Adjuvanted [facility]) injection Inject 0.5 mLs into the muscle once.    [DISCONTINUED] ergocalciferol  (DRISDOL ) 1.25 MG (50000 UT) capsule Take 1 capsule (50,000 Units total) by mouth once a week.    No facility-administered encounter medications on file as of 11/09/2023.    Surgical History: Past Surgical History:  Procedure Laterality Date   TUBAL LIGATION      Medical History: Past Medical History:  Diagnosis Date   BRCA negative 07/2023   MyRisk neg except CDKN2A VUS x 2 and MSH3 VUS   Constipation    Family history of breast cancer 07/2023   IBIS=8.3%/riskscore=9.7%   Family history of ovarian cancer    Hypertension     Pelvic pain in female    PMB (postmenopausal bleeding)    Postmenopausal bleeding 02/11/2015   Sleep apnea     Family History: Family History  Problem Relation Age of Onset   Ovarian cancer Sister        30s   Colon cancer Brother        39s   Asthma Maternal Aunt    Diabetes Maternal Aunt    Kidney disease Maternal Aunt    Breast cancer Maternal Aunt        late years   Heart disease Neg Hx     Social History   Socioeconomic History   Marital status: Married    Spouse name: Not on file   Number of children: Not on file   Years of education: Not on file   Highest education level: Not on file  Occupational History   Not on file  Tobacco Use   Smoking status: Never   Smokeless tobacco: Never  Substance and Sexual Activity   Alcohol use: Yes    Comment: occas   Drug use: No   Sexual activity: Yes    Birth control/protection: Surgical  Other Topics Concern   Not on file  Social History Narrative   Not on file   Social Drivers of Health   Financial Resource Strain: Not on file  Food Insecurity: Not on file  Transportation Needs: Not on file  Physical Activity: Not on file  Stress: Not on file  Social Connections: Not on file  Intimate Partner Violence: Not on file      Review of Systems  Constitutional:  Negative for activity change, appetite change, chills, fatigue, fever and unexpected weight change.  HENT: Negative.  Negative for congestion, ear pain, rhinorrhea, sore throat and trouble swallowing.   Eyes: Negative.   Respiratory: Negative.  Negative for cough, chest tightness, shortness of breath and wheezing.   Cardiovascular: Negative.  Negative for chest pain.  Gastrointestinal: Negative.  Negative for abdominal pain, blood in stool, constipation, diarrhea, nausea and vomiting.  Endocrine: Negative.   Genitourinary: Negative.  Negative for difficulty urinating, dysuria, frequency, hematuria and urgency.  Musculoskeletal:  Positive for arthralgias  and back pain. Negative for joint swelling, myalgias and neck pain.  Skin:  Negative for wound.  Allergic/Immunologic: Negative.  Negative for immunocompromised state.  Neurological: Negative.  Negative for dizziness, seizures, numbness and headaches.  Hematological: Negative.   Psychiatric/Behavioral: Negative.  Negative for behavioral problems, self-injury and suicidal ideas. The patient is not nervous/anxious.     Vital Signs: BP 125/78 Comment: 144/82  Pulse 70   Temp 98.3 F (36.8 C)   Resp 16   Ht 4' 11 (1.499 m)   Wt 148 lb 3.2 oz (67.2 kg)   LMP 01/09/2015 (Approximate)   SpO2 97%   BMI 29.93 kg/m    Physical Exam Vitals reviewed.  Constitutional:      General: She is awake. She is not in acute distress.    Appearance: Normal appearance. She is well-developed and well-groomed. She is obese. She is not ill-appearing or diaphoretic.  HENT:     Head: Normocephalic and atraumatic.     Right Ear: Tympanic membrane, ear canal and external ear normal.     Left Ear: Tympanic membrane, ear canal and external ear normal.     Nose: Nose normal. No congestion or rhinorrhea.     Mouth/Throat:     Lips: Pink.     Mouth: Mucous membranes are moist.     Pharynx: Oropharynx is clear. Uvula midline. No oropharyngeal exudate or posterior oropharyngeal erythema.  Eyes:     General: Lids are normal. Vision grossly intact. Gaze aligned appropriately. No scleral icterus.       Right eye: No discharge.        Left eye: No discharge.     Extraocular Movements: Extraocular movements intact.     Conjunctiva/sclera: Conjunctivae normal.     Pupils: Pupils are equal, round, and reactive to light.     Funduscopic exam:    Right eye: Red reflex present.        Left eye: Red reflex present. Neck:     Thyroid : No thyromegaly.     Vascular: No carotid bruit or JVD.     Trachea: Trachea and phonation normal. No tracheal deviation.  Cardiovascular:     Rate and Rhythm: Normal rate and regular  rhythm.     Pulses: Normal pulses.     Heart sounds: Normal heart sounds, S1 normal and S2 normal. No murmur heard.    No friction rub. No gallop.  Pulmonary:     Effort: Pulmonary effort is normal. No accessory muscle usage or respiratory distress.     Breath sounds: Normal air entry. No stridor. No decreased breath sounds, wheezing or rales.  Chest:     Chest wall: No tenderness.  Breasts:    Breasts are symmetrical.     Right: Normal. No swelling, bleeding,  inverted nipple, mass, nipple discharge, skin change or tenderness.     Left: Normal. No swelling, bleeding, inverted nipple, mass, nipple discharge, skin change or tenderness.  Abdominal:     General: Bowel sounds are normal. There is no distension.     Palpations: Abdomen is soft. There is no mass.     Tenderness: There is no abdominal tenderness. There is no guarding or rebound.  Musculoskeletal:        General: No tenderness or deformity. Normal range of motion.     Cervical back: Normal range of motion and neck supple.     Right lower leg: No edema.     Left lower leg: No edema.  Lymphadenopathy:     Cervical: No cervical adenopathy.     Upper Body:     Right upper body: No supraclavicular, axillary or pectoral adenopathy.     Left upper body: No supraclavicular, axillary or pectoral adenopathy.  Skin:    General: Skin is warm and dry.     Capillary Refill: Capillary refill takes less than 2 seconds.     Coloration: Skin is not pale.     Findings: No erythema or rash.  Neurological:     Mental Status: She is alert and oriented to person, place, and time.     Cranial Nerves: No cranial nerve deficit.     Motor: No abnormal muscle tone.     Coordination: Coordination normal.     Gait: Gait normal.     Deep Tendon Reflexes: Reflexes are normal and symmetric.  Psychiatric:        Mood and Affect: Mood normal.        Behavior: Behavior normal. Behavior is cooperative.        Thought Content: Thought content normal.         Judgment: Judgment normal.        Assessment/Plan: 1. Encounter for general adult medical examination with abnormal findings (Primary) Age-appropriate preventive screenings and vaccinations discussed, annual physical exam completed. Routine labs for health maintenance are up to date, will be repeated again in march. PHM updated.    2. Essential hypertension Continue amlodipine  and triamterene -hydrochlorothiazide as prescribed.   3. Mixed hyperlipidemia Continue rosuvastatin  as prescribed.   4. Encounter for screening mammogram for malignant neoplasm of breast Due for routine mammogram this month - MM 3D SCREENING MAMMOGRAM BILATERAL BREAST; Future  5. Impaired fasting glucose Add an A1c lab in march to screening for diabetes.      General Counseling: Inocente oakland understanding of the findings of todays visit and agrees with plan of treatment. I have discussed any further diagnostic evaluation that may be needed or ordered today. We also reviewed her medications today. she has been encouraged to call the office with any questions or concerns that should arise related to todays visit.    Orders Placed This Encounter  Procedures   MM 3D SCREENING MAMMOGRAM BILATERAL BREAST    No orders of the defined types were placed in this encounter.   Return in about 11 weeks (around 01/25/2024) for F/U, PAP ONLY, Marua Qin PCP .   Total time spent:30 Minutes Time spent includes review of chart, medications, test results, and follow up plan with the patient.   [place] Controlled Substance Database was reviewed by me.  This patient was seen by Mardy Maxin, FNP-C in collaboration with Dr. Sigrid Bathe as a part of collaborative care agreement.  Dagan Heinz R. Maxin, MSN, FNP-C Internal medicine

## 2023-11-16 ENCOUNTER — Other Ambulatory Visit: Payer: Self-pay | Admitting: Nurse Practitioner

## 2023-11-16 DIAGNOSIS — Z1231 Encounter for screening mammogram for malignant neoplasm of breast: Secondary | ICD-10-CM | POA: Diagnosis not present

## 2023-11-16 DIAGNOSIS — I10 Essential (primary) hypertension: Secondary | ICD-10-CM

## 2023-12-20 ENCOUNTER — Encounter: Payer: Self-pay | Admitting: Internal Medicine

## 2023-12-20 ENCOUNTER — Ambulatory Visit (INDEPENDENT_AMBULATORY_CARE_PROVIDER_SITE_OTHER): Admitting: Internal Medicine

## 2023-12-20 VITALS — BP 130/83 | HR 90 | Temp 97.8°F | Resp 16 | Ht 59.0 in | Wt 151.0 lb

## 2023-12-20 DIAGNOSIS — G4733 Obstructive sleep apnea (adult) (pediatric): Secondary | ICD-10-CM

## 2023-12-20 NOTE — Progress Notes (Signed)
 [facility] [address] [place], [place] [address]  Pulmonary Sleep Medicine   Office Visit Note  Patient Name: Barbara Cantrell DOB: [birth year] MRN [id number]  Date of Service: 12/20/2023  Complaints/HPI: She has been doing well with the CPAP. She has excellent compliance. She uses the machine for at leat 6h. No nasal congestion. No infections. She states machine is working fine. Her supplies are up to date, her machine is at 58 years old.   Office Spirometry Results:     ROS  General: (-) fever, (-) chills, (-) night sweats, (-) weakness Skin: (-) rashes, (-) itching,. Eyes: (-) visual changes, (-) redness, (-) itching. Nose and Sinuses: (-) nasal stuffiness or itchiness, (-) postnasal drip, (-) nosebleeds, (-) sinus trouble. Mouth and Throat: (-) sore throat, (-) hoarseness. Neck: (-) swollen glands, (-) enlarged thyroid , (-) neck pain. Respiratory: - cough, (-) bloody sputum, - shortness of breath, - wheezing. Cardiovascular: - ankle swelling, (-) chest pain. Lymphatic: (-) lymph node enlargement. Neurologic: (-) numbness, (-) tingling. Psychiatric: (-) anxiety, (-) depression   Current Medication: Outpatient Encounter Medications as of 12/20/2023  Medication Sig Note   amLODipine  (NORVASC ) 5 MG tablet TAKE 1 TABLET BY MOUTH EVERY DAY    rosuvastatin  (CRESTOR ) 10 MG tablet Take 1 tablet (10 mg total) by mouth daily.    triamterene -hydrochlorothiazide (MAXZIDE-25) 37.5-25 MG tablet TAKE 1 TABLET BY MOUTH EVERY DAY    [DISCONTINUED] fluticasone (FLONASE) 50 MCG/ACT nasal spray Place 1 spray into both nostrils.    [DISCONTINUED] ibuprofen (ADVIL,MOTRIN) 600 MG tablet  02/11/2015: Received from: External Pharmacy   No facility-administered encounter medications on file as of 12/20/2023.    Surgical History: Past Surgical History:  Procedure Laterality Date   TUBAL LIGATION      Medical History: Past Medical History:  Diagnosis Date   BRCA negative  07/2023   MyRisk neg except CDKN2A VUS x 2 and MSH3 VUS   Constipation    Family history of breast cancer 07/2023   IBIS=8.3%/riskscore=9.7%   Family history of ovarian cancer    Hypertension    Pelvic pain in female    PMB (postmenopausal bleeding)    Postmenopausal bleeding 02/11/2015   Sleep apnea     Family History: Family History  Problem Relation Age of Onset   Ovarian cancer Sister        30s   Colon cancer Brother        50s   Asthma Maternal Aunt    Diabetes Maternal Aunt    Kidney disease Maternal Aunt    Breast cancer Maternal Aunt        late years   Heart disease Neg Hx     Social History: Social History   Socioeconomic History   Marital status: Married    Spouse name: Not on file   Number of children: Not on file   Years of education: Not on file   Highest education level: Not on file  Occupational History   Not on file  Tobacco Use   Smoking status: Never   Smokeless tobacco: Never  Substance and Sexual Activity   Alcohol use: Yes    Comment: occas   Drug use: No   Sexual activity: Yes    Birth control/protection: Surgical  Other Topics Concern   Not on file  Social History Narrative   Not on file   Social Drivers of Health   Financial Resource Strain: Not on file  Food Insecurity: Not on file  Transportation Needs: Not on file  Physical Activity: Not on file  Stress: Not on file  Social Connections: Not on file  Intimate Partner Violence: Not on file    Vital Signs: Blood pressure 130/83, pulse 90, temperature 97.8 F (36.6 C), resp. rate 16, height 4' 11 (1.499 m), weight 151 lb (68.5 kg), last menstrual period 01/09/2015, SpO2 97%.  Examination: General Appearance: The patient is well-developed, well-nourished, and in no distress. Skin: Gross inspection of skin unremarkable. Head: normocephalic, no gross deformities. Eyes: no gross deformities noted. ENT: ears appear grossly normal no exudates. Neck: Supple. No thyromegaly.  No LAD. Respiratory: no rhonchi noted. Cardiovascular: Normal S1 and S2 without murmur or rub. Extremities: No cyanosis. pulses are equal. Neurologic: Alert and oriented. No involuntary movements.  LABS: No results found for this or any previous visit (from the past 2160 hours).  Radiology: No results found.  No results found.  No results found.  Assessment and Plan: Patient Active Problem List   Diagnosis Date Noted   Muscle cramps 11/17/2019   Urinary tract infection without hematuria 11/17/2019   Encounter for general adult medical examination with abnormal findings 10/17/2019   Dysuria 10/17/2019   OSA (obstructive sleep apnea) 03/29/2019   Acute right-sided low back pain with right-sided sciatica 11/14/2018   Loud snoring 11/14/2018   Excessive daytime sleepiness 11/14/2018   Screening for colon cancer 11/14/2018   Essential hypertension 03/28/2017   Mixed hyperlipidemia 03/28/2017   Vitamin D  deficiency 03/28/2017   Routine cervical smear 03/28/2017   Postmenopausal bleeding 02/11/2015   Family history of breast cancer 02/11/2015   Family history of ovarian cancer 02/11/2015   Family history of colon cancer 02/11/2015    1. OSA (obstructive sleep apnea) (Primary) On CPAP therapy current pressure appears to be adequate for control.  She has also got excellent compliance.  2. Obesity, morbid (HCC) Obesity Counseling: Had a lengthy discussion regarding patients BMI and weight issues. Patient was instructed on portion control as well as increased activity. Also discussed caloric restrictions with trying to maintain intake less than 2000 Kcal. Discussions were made in accordance with the 5As of weight management. Simple actions such as not eating late and if able to, taking a walk is suggested.    General Counseling: I have discussed the findings of the evaluation and examination with Barbara Cantrell.  I have also discussed any further diagnostic evaluation thatmay be needed or  ordered today. Barbara Cantrell verbalizes understanding of the findings of todays visit. We also reviewed her medications today and discussed drug interactions and side effects including but not limited excessive drowsiness and altered mental states. We also discussed that there is always a risk not just to her but also people around her. she has been encouraged to call the office with any questions or concerns that should arise related to todays visit.  No orders of the defined types were placed in this encounter.    Time spent: 67  I have personally obtained a history, examined the patient, evaluated laboratory and imaging results, formulated the assessment and plan and placed orders.    Barbara DELENA Bathe, MD [facility] Pulmonary and Critical Care Sleep medicine

## 2023-12-20 NOTE — Patient Instructions (Signed)

## 2023-12-29 ENCOUNTER — Ambulatory Visit: Admitting: Physician Assistant

## 2024-01-21 DIAGNOSIS — R059 Cough, unspecified: Secondary | ICD-10-CM | POA: Diagnosis not present

## 2024-01-21 DIAGNOSIS — J069 Acute upper respiratory infection, unspecified: Secondary | ICD-10-CM | POA: Diagnosis not present

## 2024-01-25 ENCOUNTER — Ambulatory Visit: Admitting: Nurse Practitioner

## 2024-01-25 ENCOUNTER — Encounter: Payer: Self-pay | Admitting: Nurse Practitioner

## 2024-01-25 VITALS — BP 138/86 | HR 92 | Temp 98.1°F | Resp 16 | Ht 59.0 in | Wt 148.2 lb

## 2024-01-25 DIAGNOSIS — G4733 Obstructive sleep apnea (adult) (pediatric): Secondary | ICD-10-CM | POA: Diagnosis not present

## 2024-01-25 DIAGNOSIS — E782 Mixed hyperlipidemia: Secondary | ICD-10-CM

## 2024-01-25 DIAGNOSIS — E538 Deficiency of other specified B group vitamins: Secondary | ICD-10-CM | POA: Diagnosis not present

## 2024-01-25 DIAGNOSIS — E559 Vitamin D deficiency, unspecified: Secondary | ICD-10-CM

## 2024-01-25 DIAGNOSIS — I10 Essential (primary) hypertension: Secondary | ICD-10-CM

## 2024-01-25 DIAGNOSIS — Z124 Encounter for screening for malignant neoplasm of cervix: Secondary | ICD-10-CM

## 2024-01-25 MED ORDER — ZEPBOUND 5 MG/0.5ML ~~LOC~~ SOAJ: 5.0000 mg | SUBCUTANEOUS | 5 refills | Status: AC

## 2024-01-25 NOTE — Progress Notes (Signed)
 [facility] [address] [place], [place] [address]  Internal MEDICINE  Office Visit Note  Patient Name: Barbara Cantrell  [id number]  [id number]  Date of Service: 01/25/2024  Chief Complaint  Patient presents with   Hypertension   Follow-up    HPI Barbara Cantrell presents for a follow-up visit for pap smear, hypertension, high cholesterol and lab orders. Routine pap smear -- repeat after abnormal pap smear, cervical biopsy and LEEP procedure  Hypertension -- controlled with amlodipine  and triamterene -hydrochlorothiazide High cholesterol -- taking rosuvastatin  10 mg daily. OSA -- wants to try zepbound  to help with weight loss which will help improve her OSA as well.  Due for routine labs    Current Medication: Outpatient Encounter Medications as of 01/25/2024  Medication Sig   tirzepatide  (ZEPBOUND ) 5 MG/0.5ML Pen Inject 5 mg into the skin once a week.   amLODipine  (NORVASC ) 5 MG tablet TAKE 1 TABLET BY MOUTH EVERY DAY   rosuvastatin  (CRESTOR ) 10 MG tablet Take 1 tablet (10 mg total) by mouth daily.   triamterene -hydrochlorothiazide (MAXZIDE-25) 37.5-25 MG tablet TAKE 1 TABLET BY MOUTH EVERY DAY   No facility-administered encounter medications on file as of 01/25/2024.    Surgical History: Past Surgical History:  Procedure Laterality Date   TUBAL LIGATION      Medical History: Past Medical History:  Diagnosis Date   BRCA negative 07/2023   MyRisk neg except CDKN2A VUS x 2 and MSH3 VUS   Constipation    Family history of breast cancer 07/2023   IBIS=8.3%/riskscore=9.7%   Family history of ovarian cancer    Hypertension    Pelvic pain in female    PMB (postmenopausal bleeding)    Postmenopausal bleeding 02/11/2015   Sleep apnea     Family History: Family History  Problem Relation Age of Onset   Ovarian cancer Sister        30s   Colon cancer Brother        50s   Asthma Maternal Aunt    Diabetes Maternal Aunt    Kidney disease Maternal Aunt    Breast  cancer Maternal Aunt        late years   Heart disease Neg Hx     Social History   Socioeconomic History   Marital status: Married    Spouse name: Not on file   Number of children: Not on file   Years of education: Not on file   Highest education level: Not on file  Occupational History   Not on file  Tobacco Use   Smoking status: Never   Smokeless tobacco: Never  Substance and Sexual Activity   Alcohol use: Yes    Comment: occas   Drug use: No   Sexual activity: Yes    Birth control/protection: Surgical  Other Topics Concern   Not on file  Social History Narrative   Not on file   Social Drivers of Health   Tobacco Use: Low Risk (01/25/2024)   Patient History    Smoking Tobacco Use: Never    Smokeless Tobacco Use: Never    Passive Exposure: Not on file  Financial Resource Strain: Not on file  Food Insecurity: Not on file  Transportation Needs: Not on file  Physical Activity: Not on file  Stress: Not on file  Social Connections: Not on file  Intimate Partner Violence: Not on file  Depression (PHQ2-9): Low Risk (11/09/2023)   Depression (PHQ2-9)    PHQ-2 Score: 0  Alcohol Screen: Low Risk (11/02/2021)  Alcohol Screen    Last Alcohol Screening Score (AUDIT): 1  Housing: Not on file  Utilities: Not on file  Health Literacy: Not on file      Review of Systems  Constitutional:  Positive for fatigue.  HENT: Negative.    Respiratory: Negative.  Negative for cough, chest tightness, shortness of breath and wheezing.   Cardiovascular: Negative.  Negative for chest pain and palpitations.  Gastrointestinal: Negative.   Genitourinary:  Negative for dysuria, menstrual problem, pelvic pain, vaginal bleeding, vaginal discharge and vaginal pain.  Musculoskeletal: Negative.     Vital Signs: BP 138/86   Pulse 92   Temp 98.1 F (36.7 C)   Resp 16   Ht 4' 11 (1.499 m)   Wt 148 lb 3.2 oz (67.2 kg)   LMP 01/09/2015   SpO2 97%   BMI 29.93 kg/m    Physical  Exam Vitals reviewed.  Constitutional:      General: She is not in acute distress.    Appearance: Normal appearance. She is not ill-appearing.  HENT:     Head: Normocephalic and atraumatic.  Eyes:     Pupils: Pupils are equal, round, and reactive to light.  Cardiovascular:     Rate and Rhythm: Normal rate and regular rhythm.  Pulmonary:     Effort: Pulmonary effort is normal. No respiratory distress.  Abdominal:     Hernia: There is no hernia in the left inguinal area or right inguinal area.  Genitourinary:    General: Normal vulva.     Exam position: Lithotomy position.     Pubic Area: No rash or pubic lice.      Labia:        Right: No rash, tenderness, lesion or injury.        Left: No rash, tenderness, lesion or injury.      Urethra: No prolapse, urethral pain, urethral swelling or urethral lesion.     Vagina: Normal. No signs of injury and foreign body. No vaginal discharge, erythema, tenderness, bleeding, lesions or prolapsed vaginal walls.     Cervix: Friability present. No cervical motion tenderness, discharge, lesion, erythema, cervical bleeding or eversion.     Uterus: Normal.      Adnexa: Right adnexa normal and left adnexa normal.     Rectum: Normal. No mass, tenderness, anal fissure or external hemorrhoid.  Lymphadenopathy:     Lower Body: No right inguinal adenopathy. No left inguinal adenopathy.  Neurological:     Mental Status: She is alert and oriented to person, place, and time.  Psychiatric:        Mood and Affect: Mood normal.        Behavior: Behavior normal.        Assessment/Plan: 1. Essential hypertension (Primary) Stable, continue medications as prescribed. Routine labs ordered  - CBC with Differential/Platelet - CMP14+EGFR - Lipid Profile  2. OSA on CPAP Zepbound  ordered, will work on getting this medication approved with her insurance.  - tirzepatide  (ZEPBOUND ) 5 MG/0.5ML Pen; Inject 5 mg into the skin once a week.  Dispense: 2 mL; Refill:  5  3. Mixed hyperlipidemia Routine labs ordered  - CBC with Differential/Platelet - CMP14+EGFR - Lipid Profile  4. B12 deficiency Routine labs ordered  - CBC with Differential/Platelet - B12 and Folate Panel  5. Vitamin D  deficiency Routine lab ordered  - Vitamin D  (25 hydroxy)  6. Routine cervical smear Routine pelvic exam done with pap smear.  - IGP, Aptima HPV   General Counseling:  Molly verbalizes understanding of the findings of todays visit and agrees with plan of treatment. I have discussed any further diagnostic evaluation that may be needed or ordered today. We also reviewed her medications today. she has been encouraged to call the office with any questions or concerns that should arise related to todays visit.    Orders Placed This Encounter  Procedures   CBC with Differential/Platelet   CMP14+EGFR   Lipid Profile   B12 and Folate Panel   Vitamin D  (25 hydroxy)    Meds ordered this encounter  Medications   tirzepatide  (ZEPBOUND ) 5 MG/0.5ML Pen    Sig: Inject 5 mg into the skin once a week.    Dispense:  2 mL    Refill:  5    Fill new script today, for OSA    Return in about 1 month (around 02/25/2024) for F/U, Labs, Makyla Bye PCP.   Total time spent:30 Minutes Time spent includes review of chart, medications, test results, and follow up plan with the patient.   [place] Controlled Substance Database was reviewed by me.  This patient was seen by Mardy Maxin, FNP-C in collaboration with Dr. Sigrid Bathe as a part of collaborative care agreement.   Tore Carreker R. Maxin, MSN, FNP-C Internal medicine

## 2024-01-31 LAB — IGP, APTIMA HPV: HPV Aptima: POSITIVE — AB

## 2024-02-01 ENCOUNTER — Ambulatory Visit: Payer: Self-pay | Admitting: Nurse Practitioner

## 2024-02-01 NOTE — Progress Notes (Signed)
 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 With this result and her prior colposcopy results and treatment with LEEP procedure, the recommendation is for her to have another colposcopy. I will let her gynecologist know so they can reach out to schedule with her.

## 2024-02-07 NOTE — Telephone Encounter (Signed)
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 With this result and her prior colposcopy results and  treatment with LEEP procedure, the recommendation is for her to have another colposcopy. I will let her gynecologist know so they can reach out to schedule with her.

## 2024-02-07 NOTE — Telephone Encounter (Signed)
"  Patient notified   "

## 2024-02-08 ENCOUNTER — Encounter: Payer: Self-pay | Admitting: Nurse Practitioner

## 2024-04-25 ENCOUNTER — Ambulatory Visit: Admitting: Nurse Practitioner

## 2024-11-09 ENCOUNTER — Encounter: Admitting: Nurse Practitioner

## 2024-12-17 ENCOUNTER — Ambulatory Visit: Admitting: Internal Medicine
